# Patient Record
Sex: Female | Born: 1980
Health system: Southern US, Community
[De-identification: ages and names within clinical notes are randomized; demographics above are authoritative.]

## PROBLEM LIST (undated history)

## (undated) DIAGNOSIS — E039 Hypothyroidism, unspecified: Secondary | ICD-10-CM

## (undated) DIAGNOSIS — R51 Headache: Secondary | ICD-10-CM

## (undated) DIAGNOSIS — F1590 Other stimulant use, unspecified, uncomplicated: Secondary | ICD-10-CM

## (undated) DIAGNOSIS — T7840XA Allergy, unspecified, initial encounter: Secondary | ICD-10-CM

## (undated) DIAGNOSIS — D509 Iron deficiency anemia, unspecified: Secondary | ICD-10-CM

## (undated) DIAGNOSIS — E559 Vitamin D deficiency, unspecified: Secondary | ICD-10-CM

## (undated) DIAGNOSIS — F411 Generalized anxiety disorder: Secondary | ICD-10-CM

## (undated) DIAGNOSIS — E038 Other specified hypothyroidism: Principal | ICD-10-CM

## (undated) DIAGNOSIS — D649 Anemia, unspecified: Secondary | ICD-10-CM

## (undated) DIAGNOSIS — R519 Headache, unspecified: Secondary | ICD-10-CM

## (undated) DIAGNOSIS — E063 Autoimmune thyroiditis: Principal | ICD-10-CM

## (undated) HISTORY — DX: Hypothyroidism, unspecified: E03.9

## (undated) HISTORY — DX: Generalized anxiety disorder: F41.1

## (undated) HISTORY — DX: Iron deficiency anemia, unspecified: D50.9

## (undated) HISTORY — DX: Vitamin D deficiency, unspecified: E55.9

## (undated) HISTORY — DX: Other specified hypothyroidism: E03.8

## (undated) HISTORY — DX: Allergy, unspecified, initial encounter: T78.40XA

## (undated) HISTORY — DX: Anemia, unspecified: D64.9

## (undated) HISTORY — DX: Other stimulant use, unspecified, uncomplicated: F15.90

## (undated) HISTORY — DX: Headache: R51

## (undated) HISTORY — DX: Autoimmune thyroiditis: E06.3

## (undated) HISTORY — DX: Headache, unspecified: R51.9

---

## 2011-04-15 ENCOUNTER — Ambulatory Visit: Payer: Self-pay

## 2012-08-03 ENCOUNTER — Emergency Department: Payer: Self-pay | Admitting: Emergency Medicine

## 2012-11-11 ENCOUNTER — Inpatient Hospital Stay: Payer: Self-pay | Admitting: Obstetrics and Gynecology

## 2012-11-11 LAB — CBC WITH DIFFERENTIAL/PLATELET
Basophil #: 0.1 10*3/uL (ref 0.0–0.1)
Basophil %: 0.8 %
Eosinophil #: 0.2 10*3/uL (ref 0.0–0.7)
Eosinophil %: 2.1 %
HCT: 32.4 % — ABNORMAL LOW (ref 35.0–47.0)
HGB: 10.8 g/dL — ABNORMAL LOW (ref 12.0–16.0)
Lymphocyte #: 1.7 10*3/uL (ref 1.0–3.6)
Lymphocyte %: 14.6 %
MCH: 27.1 pg (ref 26.0–34.0)
MCHC: 33.3 g/dL (ref 32.0–36.0)
MCV: 81 fL (ref 80–100)
Monocyte #: 0.6 x10 3/mm (ref 0.2–0.9)
Monocyte %: 5.6 %
Neutrophil #: 8.8 10*3/uL — ABNORMAL HIGH (ref 1.4–6.5)
Neutrophil %: 76.9 %
Platelet: 238 10*3/uL (ref 150–440)
RBC: 3.99 10*6/uL (ref 3.80–5.20)
RDW: 15.2 % — ABNORMAL HIGH (ref 11.5–14.5)
WBC: 11.5 10*3/uL — ABNORMAL HIGH (ref 3.6–11.0)

## 2012-11-12 LAB — HEMATOCRIT: HCT: 27.5 % — ABNORMAL LOW (ref 35.0–47.0)

## 2013-11-30 LAB — HM PAP SMEAR: HM Pap smear: NEGATIVE

## 2014-06-29 NOTE — Op Note (Signed)
PATIENT NAME:  Melissa Harrison, Melissa Harrison MR#:  [id number] DATE OF BIRTH:  1980-03-19  DATE OF PROCEDURE:  11/11/2012  PREOPERATIVE DIAGNOSES: Term breech, 40 weeks plus, low AFI.   POSTOPERATIVE DIAGNOSIS:  Term breech, 40 weeks plus, low AFI.   PROCEDURE: Primary LUT C-section.    ESTIMATED BLOOD LOSS: 1000.   SURGEON: Deloris Ping. Nika Yazzie, M.D.   ASSISTANYolanda Bonine: Melody Burr, CNM.   FINDINGS: Term liveborn female infant with Apgars 8 and 9. Normal uterus, tubes and ovaries.   DESCRIPTION OF PROCEDURE: The patient was taken to the operating room and placed in supine position. After adequate general epidural anesthesia was instilled, the patient was prepped and draped in the usual sterile fashion. Pfannenstiel skin incision was drawn and cut approximately 2 fingerbreadths below the pubic symphysis and carried sharply down to the fascia. Fascia was nicked in the midline. The incision was extended in a superolateral manner with the curved Mayo scissors. The muscle bellies were sharply and bluntly dissected off of the rectus fascia. At this time, the patient was feeling the incision and she was put to sleep. The muscle bellies were then identified in the midline and split. The peritoneum was grasped, cut and entered and extended the incision. Bladder blade was placed. Bladder flap was created. Uterine incision was performed. The infant's feet were delivered. The infant's body was delivered with a blue towel. Nuchal arms were delivered, first the left, then the right, and the baby's head was delivered from the uterus. Pitocin was started. Cord was clamped and cut. Infant was handed to the awaiting pediatrician. Cord blood was obtained. Placenta was delivered manually. The placenta was delivered on its own. The uterus was delivered and wrapped in a moist laparotomy sponge. The interior of the uterus was curetted with a moist lap sponge. Uterine incision was closed with a running locked chromic suture and then a running  embrocating suture. The bladder flap was tacked back up to the uterine incision. The uterus was placed back into the abdomen. The muscle bellies were closed with a running Vicryl suture. The ON-Q pain pump trocars were placed, and the catheters were threaded. The trocars were removed. The catheters were primed, placed between the fascia and the muscle belly, wrapped around the muscle bellies. The fascia was closed with a running Vicryl suture.    DICTATION ENDS delete this     ____________________________ Elliot Gurneyarrie C. Joniece Smotherman, MD cck:dmm D: 11/15/2012 11:11:00 ET T: 11/15/2012 11:29:29 ET JOB#: [id number]  cc: Elliot Gurneyarrie C. Corinthia Helmers, MD, <Dictator> Elliot GurneyARRIE C Aubreanna Percle MD ELECTRONICALLY SIGNED 11/18/2012 10:05

## 2014-06-29 NOTE — Op Note (Signed)
PATIENT NAME:  Melissa Harrison, Vincie B MR#:  [id number] DATE OF BIRTH:  March 21, 1980  DATE OF PROCEDURE:  11/11/2012  PREOPERATIVE DIAGNOSIS:  Term breech presentation.  POSTOPERATIVE DIAGNOSIS:  Term breech presentation.  PROCEDURE: Primary lower uterine transverse (LUT) cesarean section.   ESTIMATED BLOOD LOSS: 1000 mL.   SURGEON:  Deloris Ping. Ragena Fiola, M.D.   ASSISTANYolanda Bonine:  Melody Burr, CNM.   FINDINGS: Term live-born female infant with Apgars of 8 and 9. Normal uterus, tubes and ovaries with breech presentation fetus.   PROCEDURE: The patient was taken to the operating room and placed in the supine position. After adequate general endotracheal anesthesia  (Dictation Anomaly)   after the attempt of spinal which did not give the patient adequate anesthesia, the procedure started. The patient had been prepped and draped in the usual sterile fashion prior to the anesthesia. A Pfannenstiel skin incision was drawn on the skin approximately 2 fingerbreadths (Dictation Anomaly) above the pubic symphysis, and a knife was used to carry this incision sharply down to the fascia. The fascia was nicked in the midline and the incision was extended in a superolateral manner. The (Dictation Anomaly) kocher clamps were asttached to the fascia; and the fascia, and the muscle bellies were sharply and bluntly dissected (Dictation Anomaly) from each other. The muscle belly midline was identified, split in the middle. The peritoneum was grasped, cut, and sharply entered with the Metzenbaum scissors in a superior-to-inferior manner. Bladder blade was placed. A bladder flap was created. An incision was made on the uterus. The infant's right foot and buttocks were seen. The right foot was delivered and the left foot was (Dictation Anomaly) then delivered also. The infant was wrapped in a moist blue towel and delivered to the shoulders. The nuchal arms were delivered, first right, then the left, and the infant's head was delivered. The cord  was clamped and cut. The infant was handed off to the awaiting pediatrician. Cord blood was obtained. Pitocin was started. Placenta delivered on its own. Uterus was delivered, wrapped in a moist laparotomy sponge. The anterior of the uterus was curetted with a moist lap sponge. Uterine incision was closed with a running, locked chromic suture, and then a running imbricating suture. Bladder flap was tacked back up to the uterine incision. The belly was cleared of clots. The uterus was placed back into the abdomen. The muscle bellies were closed with a running Vicryl suture. The On-Q trocars were placed. The catheters were threaded. The trocars were removed. Dermabond was placed on the skin. The catheters were wrapped (Dictation Anomaly) over the muscle belly between the muscle and the fascia. The fascia was closed in a running Vicryl suture. The skin edges were approximated with a 4-0 Monocryl. Steri-Strips were placed. A 4 x 4 and Tegaderm were placed on the trocar port sites; 4 x 4's and ABDs were placed on the skin incisions, which was taped to the abdomen. The uterus was massaged, clots were cleared. Clear urine was noted in the Foley catheter, and the patient was taken to recovery after having tolerated the procedure well.    ____________________________ Elliot Gurneyarrie C. Mclean Moya, MD cck:dm D: 11/16/2012 07:18:00 ET T: 11/16/2012 09:49:48 ET JOB#: [id number]  cc: Elliot Gurneyarrie C. Sohaib Vereen, MD, <Dictator> Elliot GurneyARRIE C Harshaan Whang MD ELECTRONICALLY SIGNED 11/18/2012 10:03

## 2014-06-29 NOTE — Op Note (Signed)
PATIENT NAME:  Melissa Harrison, Melissa Harrison MR#:  [id number] DATE OF BIRTH:  1980/08/25  DATE OF PROCEDURE:  11/11/2012  PREOPERATIVE DIAGNOSIS:  Term at breech.   DICTATION ENDS (Dictation Anomaly) <<MISSING TEXT>>   ____________________________ Elliot Gurneyarrie C. Isabele Lollar, MD cck:dmm D: 11/15/2012 11:04:32 ET T: 11/15/2012 11:20:26 ET JOB#: [id number]  cc: Elliot Gurneyarrie C. Larisha Vencill, MD, <Dictator>

## 2014-07-17 NOTE — H&P (Signed)
L&D Evaluation:  History:  HPI 34yo MWF sent over from office at 94w2dwith breech presentation and mild oligohydramnious for primary c/s   Presents with breeach presentation at term   Patient's Medical History No Chronic Illness   Patient's Surgical History none   Medications Pre Natal Vitamins  Tylenol (Acetaminophen)   Allergies NKDA   Social History none   Family History Non-Contributory   ROS:  ROS All systems were reviewed.  HEENT, CNS, GI, GU, Respiratory, CV, Renal and Musculoskeletal systems were found to be normal.   Exam:  Vital Signs stable   Urine Protein not completed   General no apparent distress   Mental Status clear   Chest clear   Heart normal sinus rhythm   Abdomen gravid, non-tender   Estimated Fetal Weight Average for gestational age   Fetal Position breech   Back no CVAT   Edema no edema   Mebranes Intact   FHT normal rate with no decels   Ucx irregular   Impression:  Impression breeach presentation at 40 weeks   Plan:  Plan primary c/s for breech presentation   Electronic Signatures: BEvonnie Pat(CNM)  (Signed 05-Sep-14 17:23)  Authored: L&D Evaluation   Last Updated: 05-Sep-14 17:23 by BEvonnie Pat(CNM)

## 2014-08-17 ENCOUNTER — Ambulatory Visit (INDEPENDENT_AMBULATORY_CARE_PROVIDER_SITE_OTHER): Payer: Managed Care, Other (non HMO) | Admitting: Family Medicine

## 2014-08-17 ENCOUNTER — Telehealth: Payer: Self-pay | Admitting: Obstetrics and Gynecology

## 2014-08-17 ENCOUNTER — Encounter: Payer: Self-pay | Admitting: Family Medicine

## 2014-08-17 VITALS — BP 116/80 | HR 102 | Temp 97.2°F | Resp 18 | Ht 67.0 in | Wt 166.9 lb

## 2014-08-17 DIAGNOSIS — J45909 Unspecified asthma, uncomplicated: Secondary | ICD-10-CM | POA: Diagnosis not present

## 2014-08-17 DIAGNOSIS — R0982 Postnasal drip: Secondary | ICD-10-CM

## 2014-08-17 DIAGNOSIS — J309 Allergic rhinitis, unspecified: Secondary | ICD-10-CM

## 2014-08-17 DIAGNOSIS — H109 Unspecified conjunctivitis: Secondary | ICD-10-CM | POA: Insufficient documentation

## 2014-08-17 DIAGNOSIS — H10501 Unspecified blepharoconjunctivitis, right eye: Secondary | ICD-10-CM | POA: Diagnosis not present

## 2014-08-17 MED ORDER — NEOMYCIN-POLYMYXIN-DEXAMETH 3.5-10000-0.1 OP SUSP: 1.0000 [drp] | OPHTHALMIC | Status: DC

## 2014-08-17 MED ORDER — LEVOCETIRIZINE DIHYDROCHLORIDE 5 MG PO TABS: 5.0000 mg | ORAL_TABLET | Freq: Every evening | ORAL | Status: DC

## 2014-08-17 NOTE — Progress Notes (Signed)
Name: Melissa Harrison   MRN: [id number]    DOB: Sep 24, 1980   Date:08/17/2014       Progress Note  Subjective  Chief Complaint  Chief Complaint  Patient presents with  . Facial Swelling    for about 48 hrs, patient has had right eye swelling & redness. Patient states there has been clear drainage and some itching.    HPI  Patient is here today with concerns regarding the following symptoms itchy red eyes, swelling, discharge, pain of the right eye. No fevers, vomiting, headaches or visual loss but there is blurriness in affected eye. Using claritin for allergies which is not helping much. Had some vigamox at home which is not helping much.    Past Medical History  Diagnosis Date  . Allergy     History  Substance Use Topics  . Smoking status: Former Games developer  . Smokeless tobacco: Not on file  . Alcohol Use: 0.0 oz/week    0 Standard drinks or equivalent per week     Comment: an ocassional glass of wine     Current outpatient prescriptions:  Marland Kitchen  VIGAMOX 0.5 % ophthalmic solution, INSTILL 1 DROP INTO THE AFFECTED EYE TID FOR 7 DAYS, Disp: , Rfl: 0  No Known Allergies  ROS  10 Systems reviewed and is negative except as mentioned in HPI.   Objective  Filed Vitals:   08/17/14 0902  BP: 116/80  Pulse: 102  Temp: 97.2 F (36.2 C)  TempSrc: Oral  Resp: 18  Height:  (1.702 m)  Weight: 166 lb 14.4 oz (75.705 kg)  SpO2: 97%     Physical Exam  Constitutional: Patient appears well-developed and well-nourished. In no distress.  HEENT:  - Head: Normocephalic and atraumatic.  - Ears: Bilateral TMs gray, no erythema or effusion - Nose: Nasal mucosa moist, boggy and congested. - Mouth/Throat: Oropharynx is clear and moist. No tonsillar hypertrophy or erythema. No post nasal drainage.  - Eyes: Right conjunctivae congested, injected, dull yellow discharge on eyelashes present. EOM movements normal. PERRLA. No scleral icterus.  Neck: Normal range of motion. Neck  supple. No JVD present. No thyromegaly present.  Cardiovascular: Normal rate, regular rhythm and normal heart sounds.  No murmur heard.  Pulmonary/Chest: Effort normal and breath sounds normal. No respiratory distress. Musculoskeletal: Normal range of motion bilateral UE and LE, no joint effusions. Peripheral vascular: Bilateral LE no edema. Neurological: CN II-XII grossly intact with no focal deficits. Alert and oriented to person, place, and time. Coordination, balance, strength, speech and gait are normal.  Skin: Skin is warm and dry. No rash noted. No erythema.  Psychiatric: Patient has a normal mood and affect. Behavior is normal in office today. Judgment and thought content normal in office today.   Assessment & Plan 1. Blepharoconjunctivitis of right eye Etiologies involve allergic rhinitis, viral or bacterial infection. Instructed patient on using warm/cold compressors on affect eye(s). Be mindful of washing hands frequently to avoid spreading possible infection. Tylenol or NSAID as needed for pain, inflammation, headache. If symptoms worsen contact PCP office for referral to Ophthamologist.   - neomycin-polymyxin b-dexamethasone (MAXITROL) 3.5-10000-0.1 SUSP; Place 1 drop into the right eye every 4 (four) hours.  Dispense: 5 mL; Refill: 0  2. Allergic rhinitis with postnasal drip Stop claritin and start Xyzal. If not covered on insurance plan try Allegra. Also pick up [place] eye drops for maintenance.   - levocetirizine (XYZAL) 5 MG tablet; Take 1 tablet (5 mg total) by mouth every evening.  Dispense: 30 tablet; Refill: 2

## 2014-08-17 NOTE — Telephone Encounter (Signed)
Pt called and she called in to the advice nurse and they told her to call in the morning and try to get seen, however her eye is swollen red and grenn discharge, she does not think its pink eye because no one else has it in her family, she htinks maybe sincus, i told her that we no longer to PCP care and she is established at [facility] but I told her I would check with you first and see if you could talk to her and r/o things. Pt would like a call back

## 2014-08-17 NOTE — Patient Instructions (Signed)
Allergic Conjunctivitis  The conjunctiva is a thin membrane that covers the visible white part of the eyeball and the underside of the eyelids. This membrane protects and lubricates the eye. The membrane has small blood vessels running through it that can normally be seen. When the conjunctiva becomes inflamed, the condition is called conjunctivitis. In response to the inflammation, the conjunctival blood vessels become swollen. The swelling results in redness in the normally white part of the eye.  The blood vessels of this membrane also react when a person has allergies and is then called allergic conjunctivitis. This condition usually lasts for as long as the allergy persists. Allergic conjunctivitis cannot be passed to another person (non-contagious). The likelihood of bacterial infection is great and the cause is not likely due to allergies if the inflamed eye has:  · A sticky discharge.  · Discharge or sticking together of the lids in the morning.  · Scaling or flaking of the eyelids where the eyelashes come out.  · Red swollen eyelids.  CAUSES   · Viruses.  · Irritants such as foreign bodies.  · Chemicals.  · General allergic reactions.  · Inflammation or serious diseases in the inside or the outside of the eye or the orbit (the boney cavity in which the eye sits) can cause a "red eye."  SYMPTOMS   · Eye redness.  · Tearing.  · Itchy eyes.  · Burning feeling in the eyes.  · Clear drainage from the eye.  · Allergic reaction due to pollens or ragweed sensitivity. Seasonal allergic conjunctivitis is frequent in the spring when pollens are in the air and in the fall.  DIAGNOSIS   This condition, in its many forms, is usually diagnosed based on the history and an ophthalmological exam. It usually involves both eyes. If your eyes react at the same time every year, allergies may be the cause. While most "red eyes" are due to allergy or an infection, the role of an eye (ophthalmological) exam is important. The exam  can rule out serious diseases of the eye or orbit.  TREATMENT   · Non-antibiotic eye drops, ointments, or medications by mouth may be prescribed if the ophthalmologist is sure the conjunctivitis is due to allergies alone.  · Over-the-counter drops and ointments for allergic symptoms should be used only after other causes of conjunctivitis have been ruled out, or as your caregiver suggests.  Medications by mouth are often prescribed if other allergy-related symptoms are present. If the ophthalmologist is sure that the conjunctivitis is due to allergies alone, treatment is normally limited to drops or ointments to reduce itching and burning.  HOME CARE INSTRUCTIONS   · Wash hands before and after applying drops or ointments, or touching the inflamed eye(s) or eyelids.  · Do not let the eye dropper tip or ointment tube touch the eyelid when putting medicine in your eye.  · Stop using your soft contact lenses and throw them away. Use a new pair of lenses when recovery is complete. You should run through sterilizing cycles at least three times before use after complete recovery if the old soft contact lenses are to be used. Hard contact lenses should be stopped. They need to be thoroughly sterilized before use after recovery.  · Itching and burning eyes due to allergies is often relieved by using a cool cloth applied to closed eye(s).  SEEK MEDICAL CARE IF:   · Your problems do not go away after two or three days of treatment.  ·   Your lids are sticky (especially in the morning when you wake up) or stick together.  · Discharge develops. Antibiotics may be needed either as drops, ointment, or by mouth.  · You have extreme light sensitivity.  · An oral temperature above 102° F (38.9° C) develops.  · Pain in or around the eye or any other visual symptom develops.  MAKE SURE YOU:   · Understand these instructions.  · Will watch your condition.  · Will get help right away if you are not doing well or get worse.  Document  Released: 05/16/2002 Document Revised: 05/18/2011 Document Reviewed: 04/11/2007  ExitCare® Patient Information ©2015 [employer]. This information is not intended to replace advice given to you by your health care provider. Make sure you discuss any questions you have with your health care provider.

## 2014-08-20 NOTE — Telephone Encounter (Signed)
Pt was notified that she will need to contact PCP for this matter

## 2014-11-26 ENCOUNTER — Encounter: Payer: Self-pay | Admitting: *Deleted

## 2014-12-04 ENCOUNTER — Encounter: Payer: Self-pay | Admitting: Obstetrics and Gynecology

## 2015-01-09 ENCOUNTER — Encounter: Payer: Self-pay | Admitting: *Deleted

## 2015-01-17 ENCOUNTER — Encounter: Payer: Self-pay | Admitting: Obstetrics and Gynecology

## 2015-01-17 ENCOUNTER — Ambulatory Visit (INDEPENDENT_AMBULATORY_CARE_PROVIDER_SITE_OTHER): Payer: Managed Care, Other (non HMO) | Admitting: Obstetrics and Gynecology

## 2015-01-17 VITALS — BP 124/81 | HR 93 | Ht 67.0 in | Wt 168.9 lb

## 2015-01-17 DIAGNOSIS — Z01419 Encounter for gynecological examination (general) (routine) without abnormal findings: Secondary | ICD-10-CM | POA: Diagnosis not present

## 2015-01-17 NOTE — Patient Instructions (Addendum)
Levonorgestrel intrauterine device (IUD) What is this medicine? LEVONORGESTREL IUD (LEE voe nor jes trel) is a contraceptive (birth control) device. The device is placed inside the uterus by a healthcare professional. It is used to prevent pregnancy and can also be used to treat heavy bleeding that occurs during your period. Depending on the device, it can be used for 3 to 5 years. This medicine may be used for other purposes; ask your health care provider or pharmacist if you have questions. What should I tell my health care provider before I take this medicine? They need to know if you have any of these conditions: -abnormal Pap smear -cancer of the breast, uterus, or cervix -diabetes -endometritis -genital or pelvic infection now or in the past -have more than one sexual partner or your partner has more than one partner -heart disease -history of an ectopic or tubal pregnancy -immune system problems -IUD in place -liver disease or tumor -problems with blood clots or take blood-thinners -use intravenous drugs -uterus of unusual shape -vaginal bleeding that has not been explained -an unusual or allergic reaction to levonorgestrel, other hormones, silicone, or polyethylene, medicines, foods, dyes, or preservatives -pregnant or trying to get pregnant -breast-feeding How should I use this medicine? This device is placed inside the uterus by a health care professional. Talk to your pediatrician regarding the use of this medicine in children. Special care may be needed. Overdosage: If you think you have taken too much of this medicine contact a poison control center or emergency room at once. NOTE: This medicine is only for you. Do not share this medicine with others. What if I miss a dose? This does not apply. What may interact with this medicine? Do not take this medicine with any of the following medications: -amprenavir -bosentan -fosamprenavir This medicine may also interact with  the following medications: -aprepitant -barbiturate medicines for inducing sleep or treating seizures -bexarotene -griseofulvin -medicines to treat seizures like carbamazepine, ethotoin, felbamate, oxcarbazepine, phenytoin, topiramate -modafinil -pioglitazone -rifabutin -rifampin -rifapentine -some medicines to treat HIV infection like atazanavir, indinavir, lopinavir, nelfinavir, tipranavir, ritonavir -St. John's wort -warfarin This list may not describe all possible interactions. Give your health care provider a list of all the medicines, herbs, non-prescription drugs, or dietary supplements you use. Also tell them if you smoke, drink alcohol, or use illegal drugs. Some items may interact with your medicine. What should I watch for while using this medicine? Visit your doctor or health care professional for regular check ups. See your doctor if you or your partner has sexual contact with others, becomes HIV positive, or gets a sexual transmitted disease. This product does not protect you against HIV infection (AIDS) or other sexually transmitted diseases. You can check the placement of the IUD yourself by reaching up to the top of your vagina with clean fingers to feel the threads. Do not pull on the threads. It is a good habit to check placement after each menstrual period. Call your doctor right away if you feel more of the IUD than just the threads or if you cannot feel the threads at all. The IUD may come out by itself. You may become pregnant if the device comes out. If you notice that the IUD has come out use a backup birth control method like condoms and call your health care provider. Using tampons will not change the position of the IUD and are okay to use during your period. What side effects may I notice from receiving this medicine?  Side effects that you should report to your doctor or health care professional as soon as possible: -allergic reactions like skin rash, itching or  hives, swelling of the face, lips, or tongue -fever, flu-like symptoms -genital sores -high blood pressure -no menstrual period for 6 weeks during use -pain, swelling, warmth in the leg -pelvic pain or tenderness -severe or sudden headache -signs of pregnancy -stomach cramping -sudden shortness of breath -trouble with balance, talking, or walking -unusual vaginal bleeding, discharge -yellowing of the eyes or skin Side effects that usually do not require medical attention (report to your doctor or health care professional if they continue or are bothersome): -acne -breast pain -change in sex drive or performance -changes in weight -cramping, dizziness, or faintness while the device is being inserted -headache -irregular menstrual bleeding within first 3 to 6 months of use -nausea This list may not describe all possible side effects. Call your doctor for medical advice about side effects. You may report side effects to FDA at 1-800-FDA-1088. Where should I keep my medicine? This does not apply. NOTE: This sheet is a summary. It may not cover all possible information. If you have questions about this medicine, talk to your doctor, pharmacist, or health care provider.    2016, Elsevier/Gold Standard. (2011-03-26 13:54:04)  Place annual gynecologic exam patient instructions here.  Thank you for enrolling in [facility]. Please follow the instructions below to securely access your online medical record. MyChart allows you to send messages to your doctor, view your test results, manage appointments, and more.   How Do I Sign Up? 1. In your Internet browser, go to [employer] and enter [email or web address]. 2. Click on the Sign Up Now link in the Sign In box. You will see the New Member Sign Up page. 3. Enter your MyChart Access Code exactly as it appears below. You will not need to use this code after you've completed the sign-up process. If you do not sign up before the  expiration date, you must request a new code.  MyChart Access Code: [email or web address] Expires: 03/05/2015  2:11 AM  4. Enter your Social Security Number ([email or web address]) and Date of Birth (mm/dd/yyyy) as indicated and click Submit. You will be taken to the next sign-up page. 5. Create a MyChart ID. This will be your MyChart login ID and cannot be changed, so think of one that is secure and easy to remember. 6. Create a MyChart password. You can change your password at any time. 7. Enter your Password Reset Question and Answer. This can be used at a later time if you forget your password.  8. Enter your e-mail address. You will receive e-mail notification when new information is available in [facility]. 9. Click Sign Up. You can now view your medical record.   Additional Information Remember, MyChart is NOT to be used for urgent needs. For medical emergencies, dial 911.

## 2015-01-17 NOTE — Progress Notes (Signed)
  Subjective:     Melissa Harrison is a 34 y.o. female and is here for a comprehensive physical exam. The patient reports no problems, but is interested in IUD.Marland Kitchen  Social History   Social History  . Marital Status: Married    Spouse Name: N/A  . Number of Children: 1  . Years of Education: 16   Occupational History  . Property Manager     [place] History Main Topics  . Smoking status: Former Research scientist (life sciences)  . Smokeless tobacco: Not on file  . Alcohol Use: 0.0 oz/week    0 Standard drinks or equivalent per week     Comment: an ocassional glass of wine  . Drug Use: No  . Sexual Activity:    Partners: Male    Birth Control/ Protection: None   Other Topics Concern  . Not on file   Social History Narrative   Health Maintenance  Topic Date Due  . HIV Screening  04/17/1995  . TETANUS/TDAP  04/17/1999  . INFLUENZA VACCINE  10/08/2014  . PAP SMEAR  11/30/2016    The following portions of the patient's history were reviewed and updated as appropriate: allergies, current medications, past family history, past medical history, past social history, past surgical history and problem list.  Review of Systems A comprehensive review of systems was negative.   Objective:    General appearance: alert, cooperative and appears stated age Neck: no adenopathy, no carotid bruit, no JVD, supple, symmetrical, trachea midline and thyroid not enlarged, symmetric, no tenderness/mass/nodules Lungs: clear to auscultation bilaterally Breasts: normal appearance, no masses or tenderness Heart: regular rate and rhythm, S1, S2 normal, no murmur, click, rub or gallop Abdomen: soft, non-tender; bowel sounds normal; no masses,  no organomegaly Pelvic: cervix normal in appearance, external genitalia normal, no adnexal masses or tenderness, no cervical motion tenderness, rectovaginal septum normal, uterus normal size, shape, and consistency and vagina normal without discharge    Assessment:    Healthy female exam. Contraception education      Plan:  Pap and labs not needed RTC in 2 weeks for IUD insertion and then 1 year for AE    See After Visit Summary for Counseling Recommendations

## 2015-01-23 ENCOUNTER — Other Ambulatory Visit: Payer: Self-pay | Admitting: Family Medicine

## 2015-01-23 DIAGNOSIS — H10501 Unspecified blepharoconjunctivitis, right eye: Secondary | ICD-10-CM

## 2015-01-23 NOTE — Telephone Encounter (Signed)
Need refill on Neomycin eye drops. Please send to [facility]

## 2015-01-23 NOTE — Telephone Encounter (Signed)
Refill request was sent to Dr. Ashany Sundaram for approval and submission.  

## 2015-05-22 ENCOUNTER — Encounter: Payer: Self-pay | Admitting: Obstetrics and Gynecology

## 2015-05-22 ENCOUNTER — Ambulatory Visit (INDEPENDENT_AMBULATORY_CARE_PROVIDER_SITE_OTHER): Payer: Managed Care, Other (non HMO) | Admitting: Obstetrics and Gynecology

## 2015-05-22 VITALS — BP 117/79 | HR 93 | Ht 67.0 in | Wt 167.7 lb

## 2015-05-22 DIAGNOSIS — R102 Pelvic and perineal pain: Secondary | ICD-10-CM | POA: Diagnosis not present

## 2015-05-22 NOTE — Progress Notes (Signed)
Subjective:     Patient ID: Melissa Harrison, female   DOB: 06-22-1980, 35 y.o.   MRN: [id number]  HPI  Reports sudden onset RLQ pain a week ago that lasted for a few hours, then reoccured 3 days ago, no aggrivating or alleviating factors. Denies pain with sex or changes in BM, urination, or activity. Not had this pain in the past.occured around ovulation and next menses is due next week. Denies fever. Review of Systems See above    Objective:   Physical Exam A&O x4  well gromed female in no distress Blood pressure 117/79, pulse 93, height 5' 7"  (1.702 m), weight 167 lb 11.2 oz (76.068 kg), last menstrual period 05/03/2015. Abdomen soft and non-tender, BS x4 normal Pelvic exam: normal external genitalia, vulva, vagina, cervix, uterus and adnexa, ADNEXA: tenderness right, no masses.    Assessment:     RLP intermittent; suspected ovarian cyst     Plan:     Counseled on ovarian cyst and typically spontaneous resolution, info given to review.  To call for pelvic u/s if pain persist after menses or worsens.  Melody Dalton, CNM

## 2015-05-22 NOTE — Patient Instructions (Signed)
Ovarian Cyst An ovarian cyst is a fluid-filled sac that forms on an ovary. The ovaries are small organs that produce eggs in women. Various types of cysts can form on the ovaries. Most are not cancerous. Many do not cause problems, and they often go away on their own. Some may cause symptoms and require treatment. Common types of ovarian cysts include:  Functional cysts--These cysts may occur every month during the menstrual cycle. This is normal. The cysts usually go away with the next menstrual cycle if the woman does not get pregnant. Usually, there are no symptoms with a functional cyst.  Endometrioma cysts--These cysts form from the tissue that lines the uterus. They are also called "chocolate cysts" because they become filled with blood that turns brown. This type of cyst can cause pain in the lower abdomen during intercourse and with your menstrual period.  Cystadenoma cysts--This type develops from the cells on the outside of the ovary. These cysts can get very big and cause lower abdomen pain and pain with intercourse. This type of cyst can twist on itself, cut off its blood supply, and cause severe pain. It can also easily rupture and cause a lot of pain.  Dermoid cysts--This type of cyst is sometimes found in both ovaries. These cysts may contain different kinds of body tissue, such as skin, teeth, hair, or cartilage. They usually do not cause symptoms unless they get very big.  Theca lutein cysts--These cysts occur when too much of a certain hormone (human chorionic gonadotropin) is produced and overstimulates the ovaries to produce an egg. This is most common after procedures used to assist with the conception of a baby (in vitro fertilization). CAUSES   Fertility drugs can cause a condition in which multiple large cysts are formed on the ovaries. This is called ovarian hyperstimulation syndrome.  A condition called polycystic ovary syndrome can cause hormonal imbalances that can lead to  nonfunctional ovarian cysts. SIGNS AND SYMPTOMS  Many ovarian cysts do not cause symptoms. If symptoms are present, they may include:  Pelvic pain or pressure.  Pain in the lower abdomen.  Pain during sexual intercourse.  Increasing girth (swelling) of the abdomen.  Abnormal menstrual periods.  Increasing pain with menstrual periods.  Stopping having menstrual periods without being pregnant. DIAGNOSIS  These cysts are commonly found during a routine or annual pelvic exam. Tests may be ordered to find out more about the cyst. These tests may include:  Ultrasound.  X-ray of the pelvis.  CT scan.  MRI.  Blood tests. TREATMENT  Many ovarian cysts go away on their own without treatment. Your health care provider may want to check your cyst regularly for 2-3 months to see if it changes. For women in menopause, it is particularly important to monitor a cyst closely because of the higher rate of ovarian cancer in menopausal women. When treatment is needed, it may include any of the following:  A procedure to drain the cyst (aspiration). This may be done using a long needle and ultrasound. It can also be done through a laparoscopic procedure. This involves using a thin, lighted tube with a tiny camera on the end (laparoscope) inserted through a small incision.  Surgery to remove the whole cyst. This may be done using laparoscopic surgery or an open surgery involving a larger incision in the lower abdomen.  Hormone treatment or birth control pills. These methods are sometimes used to help dissolve a cyst. HOME CARE INSTRUCTIONS   Only take over-the-counter  or prescription medicines as directed by your health care provider.  Follow up with your health care provider as directed.  Get regular pelvic exams and Pap tests. SEEK MEDICAL CARE IF:   Your periods are late, irregular, or painful, or they stop.  Your pelvic pain or abdominal pain does not go away.  Your abdomen becomes  larger or swollen.  You have pressure on your bladder or trouble emptying your bladder completely.  You have pain during sexual intercourse.  You have feelings of fullness, pressure, or discomfort in your stomach.  You lose weight for no apparent reason.  You feel generally ill.  You become constipated.  You lose your appetite.  You develop acne.  You have an increase in body and facial hair.  You are gaining weight, without changing your exercise and eating habits.  You think you are pregnant. SEEK IMMEDIATE MEDICAL CARE IF:   You have increasing abdominal pain.  You feel sick to your stomach (nauseous), and you throw up (vomit).  You develop a fever that comes on suddenly.  You have abdominal pain during a bowel movement.  Your menstrual periods become heavier than usual. MAKE SURE YOU:  Understand these instructions.  Will watch your condition.  Will get help right away if you are not doing well or get worse.   This information is not intended to replace advice given to you by your health care provider. Make sure you discuss any questions you have with your health care provider.   Document Released: 02/23/2005 Document Revised: 02/28/2013 Document Reviewed: 10/31/2012 Elsevier Interactive Patient Education [employer].

## 2015-06-07 ENCOUNTER — Emergency Department
Admission: EM | Admit: 2015-06-07 | Discharge: 2015-06-07 | Disposition: A | Discharge status: Home / Self Care | Payer: Managed Care, Other (non HMO) | Attending: Student | Admitting: Student

## 2015-06-07 ENCOUNTER — Encounter: Payer: Self-pay | Admitting: Emergency Medicine

## 2015-06-07 ENCOUNTER — Telehealth: Payer: Self-pay | Admitting: Obstetrics and Gynecology

## 2015-06-07 ENCOUNTER — Emergency Department: Payer: Managed Care, Other (non HMO)

## 2015-06-07 DIAGNOSIS — R1031 Right lower quadrant pain: Secondary | ICD-10-CM | POA: Diagnosis present

## 2015-06-07 DIAGNOSIS — N83201 Unspecified ovarian cyst, right side: Secondary | ICD-10-CM

## 2015-06-07 DIAGNOSIS — Z87891 Personal history of nicotine dependence: Secondary | ICD-10-CM | POA: Insufficient documentation

## 2015-06-07 DIAGNOSIS — R52 Pain, unspecified: Secondary | ICD-10-CM

## 2015-06-07 LAB — COMPREHENSIVE METABOLIC PANEL
ALT: 21 U/L (ref 14–54)
AST: 24 U/L (ref 15–41)
Albumin: 4.7 g/dL (ref 3.5–5.0)
Alkaline Phosphatase: 57 U/L (ref 38–126)
Anion gap: 3 — ABNORMAL LOW (ref 5–15)
BUN: 15 mg/dL (ref 6–20)
CO2: 29 mmol/L (ref 22–32)
Calcium: 9.7 mg/dL (ref 8.9–10.3)
Chloride: 102 mmol/L (ref 101–111)
Creatinine, Ser: 0.49 mg/dL (ref 0.44–1.00)
GFR calc Af Amer: >60 mL/min (ref >60)
GFR calc non Af Amer: >60 mL/min (ref >60)
Glucose, Bld: 99 mg/dL (ref 65–99)
Potassium: 3.9 mmol/L (ref 3.5–5.1)
Sodium: 134 mmol/L — ABNORMAL LOW (ref 135–145)
Total Bilirubin: 0.6 mg/dL (ref 0.3–1.2)
Total Protein: 8.1 g/dL (ref 6.5–8.1)

## 2015-06-07 LAB — CHLAMYDIA/NGC RT PCR (ARMC ONLY)
Chlamydia Tr: NOT DETECTED
N gonorrhoeae: NOT DETECTED

## 2015-06-07 LAB — CBC
HCT: 32.3 % — ABNORMAL LOW (ref 35.0–47.0)
Hemoglobin: 10 g/dL — ABNORMAL LOW (ref 12.0–16.0)
MCH: 20.2 pg — ABNORMAL LOW (ref 26.0–34.0)
MCHC: 31 g/dL — ABNORMAL LOW (ref 32.0–36.0)
MCV: 65.4 fL — ABNORMAL LOW (ref 80.0–100.0)
Platelets: 349 10*3/uL (ref 150–440)
RBC: 4.93 MIL/uL (ref 3.80–5.20)
RDW: 16.9 % — ABNORMAL HIGH (ref 11.5–14.5)
WBC: 7.8 10*3/uL (ref 3.6–11.0)

## 2015-06-07 LAB — WET PREP, GENITAL
Clue Cells Wet Prep HPF POC: NONE SEEN
Sperm: NONE SEEN
Trich, Wet Prep: NONE SEEN
Yeast Wet Prep HPF POC: NONE SEEN

## 2015-06-07 LAB — URINALYSIS COMPLETE WITH MICROSCOPIC (ARMC ONLY)
Bacteria, UA: NONE SEEN
Bilirubin Urine: NEGATIVE
Glucose, UA: NEGATIVE mg/dL
Hgb urine dipstick: NEGATIVE
Leukocytes, UA: NEGATIVE
Nitrite: NEGATIVE
Protein, ur: NEGATIVE mg/dL
Specific Gravity, Urine: 1.016 (ref 1.005–1.030)
pH: 6 (ref 5.0–8.0)

## 2015-06-07 LAB — LIPASE, BLOOD: Lipase: 17 U/L (ref 11–51)

## 2015-06-07 LAB — POCT PREGNANCY, URINE: Preg Test, Ur: NEGATIVE

## 2015-06-07 MED ORDER — TRAMADOL HCL 50 MG PO TABS: 50.0000 mg | ORAL_TABLET | Freq: Four times a day (QID) | ORAL | Status: DC | PRN

## 2015-06-07 NOTE — Telephone Encounter (Signed)
PT CALLED AND SHE STATED THAT THE PAIN SHE WAS HAVING A FEW WEEKS AGO WHEN SHE WAS HERE IS BACK AND SHE WANTED TO BE SEEN BUT NO PROVIDERS WERE IN OFFICE ON 06/07/15, TOLD HER WE WOULD GET BACK WITH HER ON MONDAY AND TRY TO FIT HER IN IF SHE NEEDED TO E SEEN.

## 2015-06-07 NOTE — ED Notes (Signed)
Pt c/o RLQ abdominal pain.  OBGYN thought may have ovarian cyst and explained it will go away with period.  Pain went away when pt had period but then came back.  Pain is now across entire lower abdomen. Denies vomiting or diarrhea.  Increased urinary frequency.

## 2015-06-07 NOTE — Telephone Encounter (Signed)
Pt is having persists pelvic pain. Taking 400mg  ibup but not helping. LMP 05/27/2015. NO uti sx. BM- wnl. Advised to increase ibup to 800 q 8h. Use heating pad. LV MNS suspected a ovarian cyst. Advised pt if sx persists she would need an u/s. Appt made for Tuesday at 9:45 and f/u with mns on Wed at 10:45. If ibup 800 is not helping pt advised to go to ER. Pt voices understanding.

## 2015-06-07 NOTE — ED Provider Notes (Signed)
[facility] Emergency Department Provider Note  ____________________________________________  Time seen: Approximately 12:16 PM  I have reviewed the triage vital signs and the nursing notes.   HISTORY  Chief Complaint Abdominal Pain    HPI [name] is a 35 y.o. female with history of anemia who presents for evaluation one day right lower quadrant pain which is now generalized to the entire lower abdomen, gradual onset, constant since onset, currently mild to moderate, no modifying factors. The patient reports that approximately a month ago she was seen by an OB/GYN doctor for right lower quadrant pain. She was thought to have a ovarian cyst clinically (no imaging performed). Her doctor told her that the pain was likely to go away after she had a menstrual period. She reports she did have a normal menstrual period approximately 10-12 days ago and her pain resolved however yesterday it reoccurred. No nausea, vomiting, diarrhea, fevers or chills. No chest pain or difficulty breathing. No abnormal vaginal bleeding or vaginal discharge.   Past Medical History  Diagnosis Date  . Allergy   . Anemia   . Headache     Patient Active Problem List   Diagnosis Date Noted  . Conjunctivitis, right eye 08/17/2014    Past Surgical History  Procedure Laterality Date  . Cesarean section      Current Outpatient Rx  Name  Route  Sig  Dispense  Refill  . cetirizine (ZYRTEC) 10 MG tablet   Oral   Take 10 mg by mouth daily. Reported on 05/22/2015         . neomycin-polymyxin b-dexamethasone (MAXITROL) 3.5-10000-0.1 SUSP   Right Eye   Place 1 drop into the right eye every 4 (four) hours. Patient not taking: Reported on 01/17/2015   5 mL   0   . traMADol (ULTRAM) 50 MG tablet   Oral   Take 1 tablet (50 mg total) by mouth every 6 (six) hours as needed.   15 tablet   0   . VIGAMOX 0.5 % ophthalmic solution      Reported on 05/22/2015      0      Dispense as written.     Allergies Penicillins  Family History  Problem Relation Age of Onset  . Cancer Father   . Cancer Paternal Grandmother     breast cancer  . Diabetes Sister   . Diabetes Brother   . Heart disease Brother   . Cancer Maternal Aunt     uterine    Social History Social History  Substance Use Topics  . Smoking status: Former Research scientist (life sciences)  . Smokeless tobacco: None  . Alcohol Use: 0.0 oz/week    0 Standard drinks or equivalent per week     Comment: an ocassional glass of wine    Review of Systems Constitutional: No fever/chills Eyes: No visual changes. ENT: No sore throat. Cardiovascular: Denies chest pain. Respiratory: Denies shortness of breath. Gastrointestinal: + abdominal pain.  No nausea, no vomiting.  No diarrhea.  No constipation. Genitourinary: Negative for dysuria. Musculoskeletal: Negative for back pain. Skin: Negative for rash. Neurological: Negative for headaches, focal weakness or numbness.  10-point ROS otherwise negative.  ____________________________________________   PHYSICAL EXAM:  VITAL SIGNS: ED Triage Vitals  Enc Vitals Group     BP 06/07/15 1119 132/91 mmHg     Pulse Rate 06/07/15 1119 85     Resp 06/07/15 1119 16     Temp 06/07/15 1119 98.6 F (37 C)  Temp Source 06/07/15 1119 Oral     SpO2 06/07/15 1119 100 %     Weight 06/07/15 1119 165 lb (74.844 kg)     Height 06/07/15 1119 5' 7"  (1.702 m)     Head Cir --      Peak Flow --      Pain Score 06/07/15 1120 5     Pain Loc --      Pain Edu? --      Excl. in [place]? --     Constitutional: Alert and oriented. Well appearing and in no acute distress. Eyes: Conjunctivae are normal. PERRL. EOMI. Head: Atraumatic. Nose: No congestion/rhinnorhea. Mouth/Throat: Mucous membranes are moist.  Oropharynx non-erythematous. Neck: No stridor.  Supple without meningismus. Cardiovascular: Normal rate, regular rhythm. Grossly normal heart sounds.  Good peripheral  circulation. Respiratory: Normal respiratory effort.  No retractions. Lungs CTAB. Gastrointestinal: Soft and nontender. No distention.  No CVA tenderness. Genitourinary: Thin white non-malodorous vaginal discharge from a closed os. No CMT or BMT. Musculoskeletal: No lower extremity tenderness nor edema.  No joint effusions. Neurologic:  Normal speech and language. No gross focal neurologic deficits are appreciated. No gait instability. Skin:  Skin is warm, dry and intact. No rash noted. Psychiatric: Mood and affect are normal. Speech and behavior are normal.  ____________________________________________   LABS (all labs ordered are listed, but only abnormal results are displayed)  Labs Reviewed  WET PREP, GENITAL - Abnormal; Notable for the following:    WBC, Wet Prep HPF POC FEW (*)    All other components within normal limits  COMPREHENSIVE METABOLIC PANEL - Abnormal; Notable for the following:    Sodium 134 (*)    Anion gap 3 (*)    All other components within normal limits  CBC - Abnormal; Notable for the following:    Hemoglobin 10.0 (*)    HCT 32.3 (*)    MCV 65.4 (*)    MCH 20.2 (*)    MCHC 31.0 (*)    RDW 16.9 (*)    All other components within normal limits  URINALYSIS COMPLETEWITH MICROSCOPIC ([facility] ONLY) - Abnormal; Notable for the following:    Color, Urine YELLOW (*)    APPearance CLEAR (*)    Ketones, ur TRACE (*)    Squamous Epithelial / LPF 0-5 (*)    All other components within normal limits  CHLAMYDIA/NGC RT PCR ([facility] ONLY)  LIPASE, BLOOD  POC URINE PREG, ED  POCT PREGNANCY, URINE   ____________________________________________  EKG  none ____________________________________________  RADIOLOGY  Transvaginal ultrasound  IMPRESSION: Unremarkable pelvic ultrasound. ____________________________________________   PROCEDURES  Procedure(s) performed: None  Critical Care performed:  No  ____________________________________________   INITIAL IMPRESSION / ASSESSMENT AND PLAN / ED COURSE  Pertinent labs & imaging results that were available during my care of the patient were reviewed by me and considered in my medical decision making (see chart for details).  Melissa Harrison is a 35 y.o. female with history of anemia who presents for evaluation one day right lower quadrant pain which is now generalized to the entire lower abdomen, gradual onset, constant since onset, currently mild to moderate, no modifying factors. On exam, she is very well-appearing and in no acute distress. Vital signs stable, she is afebrile. She has a benign physical examination. No abdominal tenderness, specifically, there is no tenderness to palpation in the right upper quadrant of the right lower quadrant. She has a reassuring pelvic exam. We'll obtain screening labs, urine pregnancy test as well  as transvaginal ultrasound to evaluate for cyst, rule out torsion. She has declined any pain medication stating that her pain is minimal.  ----------------------------------------- 2:57 PM on 06/07/2015 ----------------------------------------- Labs reviewed. CBC notable for anemia with hemoglobin 10 point which appears to be her baseline. Unremarkable CMP, lipase, urinalysis, negative pregnancy test, wet prep was also negative. Unremarkable pelvic ultrasound, no cyst or torsion. She continues to feel well, sitting in bed texting on her phone. she has not required any medication here in the emergency department. I discussed with her that the exact cause of her pain is not identified today however given her well appearance, benign abdominal examination, normal labs and ultrasound, I have a low suspicion for any acute life-threatening intra-abdominal pathologies as obstruction, perforation, appendicitis. We discussed symptomatic treatment, meticulous return precautions (especially for worsening right lower  quadrant pain, fevers, vomiting or diarrhea) and she is comfortable with the discharge plan. She has previously scheduled follow-up with her OB/GYN on Tuesday morning. ____________________________________________   FINAL CLINICAL IMPRESSION(S) / ED DIAGNOSES  Final diagnoses:  Pain  RLQ abdominal pain      Joanne Gavel, MD 06/07/15 1525

## 2015-06-11 ENCOUNTER — Other Ambulatory Visit: Payer: Managed Care, Other (non HMO)

## 2015-06-11 ENCOUNTER — Telehealth: Payer: Self-pay | Admitting: Family Medicine

## 2015-06-11 DIAGNOSIS — D509 Iron deficiency anemia, unspecified: Secondary | ICD-10-CM

## 2015-06-11 NOTE — Telephone Encounter (Signed)
I reviewed patient's ER chart and previous CBC; please ask her to schedule ER follow-up appointment with me to evaluate her anemia; she has very small, pale red blood cells suggesting she's been losing blood and I want to help her find out what's going on

## 2015-06-12 ENCOUNTER — Ambulatory Visit: Payer: Managed Care, Other (non HMO) | Admitting: Obstetrics and Gynecology

## 2015-06-12 NOTE — Telephone Encounter (Signed)
Patient notified is coming by to pick up copy of results and to make an appointment

## 2015-06-19 ENCOUNTER — Ambulatory Visit: Payer: Managed Care, Other (non HMO) | Admitting: Family Medicine

## 2015-06-28 ENCOUNTER — Ambulatory Visit: Payer: Managed Care, Other (non HMO) | Admitting: Obstetrics and Gynecology

## 2015-07-02 ENCOUNTER — Ambulatory Visit: Payer: Managed Care, Other (non HMO) | Admitting: Family Medicine

## 2015-08-13 ENCOUNTER — Encounter: Payer: Self-pay | Admitting: Family Medicine

## 2015-08-13 ENCOUNTER — Ambulatory Visit (INDEPENDENT_AMBULATORY_CARE_PROVIDER_SITE_OTHER): Payer: Managed Care, Other (non HMO) | Admitting: Family Medicine

## 2015-08-13 VITALS — BP 122/72 | HR 96 | Temp 98.5°F | Resp 16 | Wt 170.9 lb

## 2015-08-13 DIAGNOSIS — D509 Iron deficiency anemia, unspecified: Secondary | ICD-10-CM

## 2015-08-13 DIAGNOSIS — R5383 Other fatigue: Secondary | ICD-10-CM

## 2015-08-13 DIAGNOSIS — R1031 Right lower quadrant pain: Secondary | ICD-10-CM | POA: Insufficient documentation

## 2015-08-13 NOTE — Progress Notes (Signed)
BP 122/72 mmHg  Pulse 96  Temp(Src) 98.5 F (36.9 C) (Oral)  Resp 16  Wt 170 lb 14.4 oz (77.52 kg)  SpO2 99%  LMP 08/05/2015   Subjective:    Patient ID: Melissa Harrison, female    DOB: Apr 09, 1980, 35 y.o.   MRN: [id number]  HPI: Melissa Harrison is a 35 y.o. female  Chief Complaint  Patient presents with  . lab review   Patient was in the ER back in March; I reviewed the ER record, and sent a note to the staff to have her make an appointment to come in to get that evaluated; unfortunately, it is now two months later before patient has come in to be seen  No blood in urine on dip or micro  She has been having RLQ pain; they did an [place] to see if ovarian issue She saw [facility] and they sent her to ER  No blood from anywhere; normal periods about every 23 days; no big clots; five days of bleeding Meat-eater, but no dark green leafy vegetables No acid reducers Mother had anemia; she battled it growing up; does not take iron; not Mediterranean that she knows of  Energy level not as good as it could be; limits alcohol; daily walks; exercises; brief very transient light-headedness; gaining weight, short-tempered, usually 150 pounds, gaining weight despite the work   Depression screen [facility] 2/9 08/13/2015 08/17/2014  Decreased Interest 0 0  Down, Depressed, Hopeless 0 0  PHQ - 2 Score 0 0   Relevant past medical, surgical, family and social history reviewed PMH: allergies, anemia, headache  Past Surgical History  Procedure Laterality Date  . Cesarean section     Family History  Problem Relation Age of Onset  . Cancer Father     LUNG  . Cancer Paternal Grandmother     breast cancer  . Diabetes Sister   . Diabetes Brother   . Heart disease Brother     from his diabetes, heart attack  . Hypertension Brother   . Cancer Maternal Aunt     UTERINE   Social History  Substance Use Topics  . Smoking status: Former Games developer  . Smokeless tobacco: None  . Alcohol Use: 0.0  oz/week    0 Standard drinks or equivalent per week     Comment: an ocassional glass of wine   Interim medical history since last visit reviewed. Allergies and medications reviewed  Review of Systems Per HPI unless specifically indicated above     Objective:    BP 122/72 mmHg  Pulse 96  Temp(Src) 98.5 F (36.9 C) (Oral)  Resp 16  Wt 170 lb 14.4 oz (77.52 kg)  SpO2 99%  LMP 08/05/2015  Wt Readings from Last 3 Encounters:  09/06/15 171 lb (77.565 kg)  08/13/15 170 lb 14.4 oz (77.52 kg)  06/07/15 165 lb (74.844 kg)    Physical Exam  Constitutional: She appears well-developed and well-nourished. No distress.  HENT:  Head: Normocephalic and atraumatic.  Eyes: EOM are normal. No scleral icterus.  Neck: No thyromegaly present.  Cardiovascular: Normal rate, regular rhythm and normal heart sounds.   No murmur heard. Pulmonary/Chest: Effort normal and breath sounds normal. No respiratory distress. She has no wheezes.  Abdominal: Soft. Bowel sounds are normal. She exhibits no distension.  Musculoskeletal: Normal range of motion. She exhibits no edema.  Neurological: She is alert. She exhibits normal muscle tone.  Skin: Skin is warm and dry. She is not diaphoretic. No  pallor.  Psychiatric: She has a normal mood and affect. Her behavior is normal. Judgment and thought content normal.      Assessment & Plan:   Problem List Items Addressed This Visit      Other   RLQ abdominal pain    ddx discussed; US reviewed, unremarkable; will get stool cards and CT scan abd/pelvis      Relevant Orders   Comprehensive metabolic panel (Completed)   Microcytic hypochromic anemia - Primary    Reviewed ddx; will get labs and stool cards and CT scan      Relevant Orders   CBC with Differential/Platelet (Completed)   Iron Binding Cap (TIBC) (Completed)   Ferritin (Completed)   Hemoglobinopathy evaluation (Completed)    Other Visit Diagnoses    Other fatigue        Relevant Orders     VITAMIN D 25 Hydroxy (Vit-D Deficiency, Fractures)    TSH       Follow up plan: Return if symptoms worsen or fail to improve.  An after-visit summary was printed and given to the patient at check-out.  Please see the patient instructions which may contain other information and recommendations beyond what is mentioned above in the assessment and plan.   Orders Placed This Encounter  Procedures  . CBC with Differential/Platelet  . Comprehensive metabolic panel  . Iron Binding Cap (TIBC)  . Ferritin  . Hemoglobinopathy evaluation  . VITAMIN D 25 Hydroxy (Vit-D Deficiency, Fractures)  . TSH  . TSH  . VITAMIN D 25 Hydroxy (Vit-D Deficiency, Fractures)

## 2015-08-13 NOTE — Assessment & Plan Note (Signed)
Reviewed ddx; will get labs and stool cards and CT scan

## 2015-08-13 NOTE — Patient Instructions (Signed)
Let's get labs today Do the stool cards over the next several days We'll get a CT scan If you have not heard anything from my staff in a week about any orders/referrals/studies from today, please contact us here to follow-up [phone]

## 2015-08-13 NOTE — Assessment & Plan Note (Signed)
ddx discussed; US reviewed, unremarkable; will get stool cards and CT scan abd/pelvis

## 2015-08-14 ENCOUNTER — Telehealth: Payer: Self-pay

## 2015-08-14 ENCOUNTER — Encounter: Payer: Self-pay | Admitting: Family Medicine

## 2015-08-14 ENCOUNTER — Telehealth: Payer: Self-pay | Admitting: Family Medicine

## 2015-08-14 DIAGNOSIS — E034 Atrophy of thyroid (acquired): Secondary | ICD-10-CM

## 2015-08-14 DIAGNOSIS — E039 Hypothyroidism, unspecified: Secondary | ICD-10-CM

## 2015-08-14 DIAGNOSIS — E559 Vitamin D deficiency, unspecified: Secondary | ICD-10-CM

## 2015-08-14 DIAGNOSIS — D509 Iron deficiency anemia, unspecified: Secondary | ICD-10-CM

## 2015-08-14 HISTORY — DX: Hypothyroidism, unspecified: E03.9

## 2015-08-14 HISTORY — DX: Vitamin D deficiency, unspecified: E55.9

## 2015-08-14 HISTORY — DX: Iron deficiency anemia, unspecified: D50.9

## 2015-08-14 LAB — COMPREHENSIVE METABOLIC PANEL
ALT: 14 IU/L (ref 0–32)
AST: 17 IU/L (ref 0–40)
Albumin/Globulin Ratio: 1.5 (ref 1.2–2.2)
Albumin: 4.6 g/dL (ref 3.5–5.5)
Alkaline Phosphatase: 65 IU/L (ref 39–117)
BUN/Creatinine Ratio: 16 (ref 9–23)
BUN: 10 mg/dL (ref 6–20)
Bilirubin Total: <0.2 mg/dL (ref 0.0–1.2)
CO2: 24 mmol/L (ref 18–29)
Calcium: 9.5 mg/dL (ref 8.7–10.2)
Chloride: 99 mmol/L (ref 96–106)
Creatinine, Ser: 0.64 mg/dL (ref 0.57–1.00)
GFR calc Af Amer: 134 mL/min/{1.73_m2} (ref >59)
GFR calc non Af Amer: 116 mL/min/{1.73_m2} (ref >59)
Globulin, Total: 3 g/dL (ref 1.5–4.5)
Glucose: 97 mg/dL (ref 65–99)
Potassium: 4.4 mmol/L (ref 3.5–5.2)
Sodium: 140 mmol/L (ref 134–144)
Total Protein: 7.6 g/dL (ref 6.0–8.5)

## 2015-08-14 LAB — HEMOGLOBINOPATHY EVALUATION
HGB C: 0 %
HGB S: 0 %
Hemoglobin A2 Quantitation: 1.8 % (ref 0.7–3.1)
Hemoglobin F Quantitation: 0 % (ref 0.0–2.0)
Hgb A: 98.2 % — ABNORMAL HIGH (ref 94.0–98.0)

## 2015-08-14 LAB — CBC WITH DIFFERENTIAL/PLATELET
Basophils Absolute: 0.1 10*3/uL (ref 0.0–0.2)
Basos: 1 %
EOS (ABSOLUTE): 0.4 10*3/uL (ref 0.0–0.4)
Eos: 6 %
Hematocrit: 35 % (ref 34.0–46.6)
Hemoglobin: 10.6 g/dL — ABNORMAL LOW (ref 11.1–15.9)
Immature Grans (Abs): 0 10*3/uL (ref 0.0–0.1)
Immature Granulocytes: 0 %
Lymphocytes Absolute: 2 10*3/uL (ref 0.7–3.1)
Lymphs: 26 %
MCH: 22.2 pg — ABNORMAL LOW (ref 26.6–33.0)
MCHC: 30.3 g/dL — ABNORMAL LOW (ref 31.5–35.7)
MCV: 73 fL — ABNORMAL LOW (ref 79–97)
Monocytes Absolute: 0.7 10*3/uL (ref 0.1–0.9)
Monocytes: 9 %
Neutrophils Absolute: 4.3 10*3/uL (ref 1.4–7.0)
Neutrophils: 58 %
Platelets: 355 10*3/uL (ref 150–379)
RBC: 4.78 x10E6/uL (ref 3.77–5.28)
RDW: 20 % — ABNORMAL HIGH (ref 12.3–15.4)
WBC: 7.4 10*3/uL (ref 3.4–10.8)

## 2015-08-14 LAB — IRON AND TIBC
Iron Saturation: 4 % — CL (ref 15–55)
Iron: 16 ug/dL — ABNORMAL LOW (ref 27–159)
Total Iron Binding Capacity: 408 ug/dL (ref 250–450)
UIBC: 392 ug/dL (ref 131–425)

## 2015-08-14 LAB — VITAMIN D 25 HYDROXY (VIT D DEFICIENCY, FRACTURES): Vit D, 25-Hydroxy: 24 ng/mL — ABNORMAL LOW (ref 30.0–100.0)

## 2015-08-14 LAB — FERRITIN: Ferritin: 16 ng/mL (ref 15–150)

## 2015-08-14 LAB — TSH: TSH: 20.59 u[IU]/mL — ABNORMAL HIGH (ref 0.450–4.500)

## 2015-08-14 MED ORDER — VITAMIN C 500 MG PO TABS: 500.0000 mg | ORAL_TABLET | Freq: Every day | ORAL | Status: DC

## 2015-08-14 MED ORDER — FERROUS SULFATE 325 (65 FE) MG PO TABS: 325.0000 mg | ORAL_TABLET | Freq: Every day | ORAL | Status: DC

## 2015-08-14 MED ORDER — VITAMIN D3 50 MCG (2000 UT) PO CAPS: 2000.0000 [IU] | ORAL_CAPSULE | Freq: Every day | ORAL | Status: DC

## 2015-08-14 MED ORDER — LEVOTHYROXINE SODIUM 50 MCG PO TABS: 50.0000 ug | ORAL_TABLET | Freq: Every day | ORAL | Status: DC

## 2015-08-14 NOTE — Telephone Encounter (Signed)
I talked with patient about labs Vitamin D is low, start 2,000 iu vit D3 daily Iron is low, but anemia improving; normal Hgb Electrophoresis Start iron every other day for 1 week, then daily; take with vit C to help absorption Thyroid shows she is hypothyroid; start med, 1st thing in Am with water, empty stomach; recheck labs in 6 weeks; stop med and call if palpitations, rapid weight loss, etc.

## 2015-08-14 NOTE — Telephone Encounter (Signed)
Please review pt labs

## 2015-08-19 NOTE — Telephone Encounter (Signed)
See other phone note - already addressed.

## 2015-08-27 ENCOUNTER — Ambulatory Visit (INDEPENDENT_AMBULATORY_CARE_PROVIDER_SITE_OTHER): Payer: Managed Care, Other (non HMO) | Admitting: Family Medicine

## 2015-08-29 ENCOUNTER — Encounter (INDEPENDENT_AMBULATORY_CARE_PROVIDER_SITE_OTHER): Payer: Self-pay

## 2015-09-06 ENCOUNTER — Ambulatory Visit (INDEPENDENT_AMBULATORY_CARE_PROVIDER_SITE_OTHER): Payer: Managed Care, Other (non HMO) | Admitting: Family Medicine

## 2015-09-06 ENCOUNTER — Other Ambulatory Visit: Payer: Self-pay | Admitting: Family Medicine

## 2015-09-06 ENCOUNTER — Encounter: Payer: Self-pay | Admitting: Family Medicine

## 2015-09-06 ENCOUNTER — Other Ambulatory Visit: Payer: Self-pay

## 2015-09-06 VITALS — BP 116/74 | HR 97 | Temp 98.5°F | Resp 14 | Wt 171.0 lb

## 2015-09-06 DIAGNOSIS — D509 Iron deficiency anemia, unspecified: Secondary | ICD-10-CM | POA: Diagnosis not present

## 2015-09-06 DIAGNOSIS — H04129 Dry eye syndrome of unspecified lacrimal gland: Secondary | ICD-10-CM | POA: Insufficient documentation

## 2015-09-06 DIAGNOSIS — H04123 Dry eye syndrome of bilateral lacrimal glands: Secondary | ICD-10-CM

## 2015-09-06 DIAGNOSIS — E038 Other specified hypothyroidism: Secondary | ICD-10-CM

## 2015-09-06 DIAGNOSIS — E559 Vitamin D deficiency, unspecified: Secondary | ICD-10-CM | POA: Diagnosis not present

## 2015-09-06 DIAGNOSIS — E034 Atrophy of thyroid (acquired): Secondary | ICD-10-CM | POA: Diagnosis not present

## 2015-09-06 DIAGNOSIS — Z1211 Encounter for screening for malignant neoplasm of colon: Secondary | ICD-10-CM

## 2015-09-06 LAB — CBC WITH DIFFERENTIAL/PLATELET
Basophils Absolute: 72 cells/uL (ref 0–200)
Basophils Relative: 1 %
Eosinophils Absolute: 288 cells/uL (ref 15–500)
Eosinophils Relative: 4 %
HCT: 37.4 % (ref 35.0–45.0)
Hemoglobin: 11.7 g/dL (ref 11.7–15.5)
Lymphocytes Relative: 23 %
Lymphs Abs: 1656 cells/uL (ref 850–3900)
MCH: 23.9 pg — ABNORMAL LOW (ref 27.0–33.0)
MCHC: 31.3 g/dL — ABNORMAL LOW (ref 32.0–36.0)
MCV: 76.3 fL — ABNORMAL LOW (ref 80.0–100.0)
MPV: 10 fL (ref 7.5–12.5)
Monocytes Absolute: 648 cells/uL (ref 200–950)
Monocytes Relative: 9 %
Neutro Abs: 4536 cells/uL (ref 1500–7800)
Neutrophils Relative %: 63 %
Platelets: 330 10*3/uL (ref 140–400)
RBC: 4.9 MIL/uL (ref 3.80–5.10)
RDW: 21.4 % — ABNORMAL HIGH (ref 11.0–15.0)
WBC: 7.2 10*3/uL (ref 3.8–10.8)

## 2015-09-06 LAB — POC HEMOCCULT BLD/STL (HOME/3-CARD/SCREEN)
Card #1 Date: 6282017
Card #2 Date: 6292017
Card #2 Fecal Occult Blod, POC: NEGATIVE
Card #3 Date: 6302017
Card #3 Fecal Occult Blood, POC: NEGATIVE
Fecal Occult Blood, POC: NEGATIVE

## 2015-09-06 LAB — T4, FREE: Free T4: 1.1 ng/dL (ref 0.8–1.8)

## 2015-09-06 LAB — TSH: TSH: 7.4 mIU/L — ABNORMAL HIGH

## 2015-09-06 LAB — T3, FREE: T3, Free: 2.7 pg/mL (ref 2.3–4.2)

## 2015-09-06 MED ORDER — LEVOTHYROXINE SODIUM 75 MCG PO TABS: 75.0000 ug | ORAL_TABLET | Freq: Every day | ORAL | Status: DC

## 2015-09-06 NOTE — Progress Notes (Signed)
BP 116/74 mmHg  Pulse 97  Temp(Src) 98.5 F (36.9 C) (Oral)  Resp 14  Wt 171 lb (77.565 kg)  SpO2 97%  LMP 08/08/2015 (Approximate)   Subjective:    Patient ID: Melissa Harrison, female    DOB: 10/29/1980, 35 y.o.   MRN: [id number]  HPI: Melissa Harrison is a 35 y.o. female  Chief Complaint  Patient presents with  . Follow-up   Patient is being seen sooner than expected so she can discuss her thyroid issues Actually two pound weight gain since she started thyroid medicine about 3 weeks ago Took the edge off of frustraction and not as quick tempered Not feeling jittery and no palpitations Last heart rate was 96; no chest pain Maybe a little bit of improvement in her energy; this is not where she used to be in terms of energy Hair is really dry; skin is dry Bowels are normal No fullness and soreness over the neck  Taking vitamin D  Anemia; does eat red meat; taking iron pill every other day; no visible blood in stool; returned cards last week  She goes to eye doctor; very brief dizzy episodes; goes to eye doctor next week; hopes to get restasis; chronic dry eyes; no dry mouth; can't flush anything out of her eyes, get really irritated; no joint pain, no rash  [facility] 2[facility] 08/13/2015 08/17/2014  Decreased Interest 0 0  Down, Depressed, Hopeless 0 0  PHQ - 2 Score 0 0   Relevant past medical, surgical, family and social history reviewed Past Medical History  Diagnosis Date  . Allergy   . Anemia   . Headache   . Hypothyroidism 08/14/2015  . Vitamin D deficiency 08/14/2015  . Iron deficiency anemia 08/14/2015  . Hypothyroidism due to Hashimoto's thyroiditis 09/07/2015   Past Surgical History  Procedure Laterality Date  . Cesarean section     Family History  Problem Relation Age of Onset  . Cancer Father     LUNG  . Cancer Paternal Grandmother     breast cancer  . Diabetes Sister   . Diabetes Brother   . Heart disease Brother     from his diabetes,  heart attack  . Hypertension Brother   . Cancer Maternal Aunt     UTERINE  auto-immune disease, siblings have diabetes  Social History  Substance Use Topics  . Smoking status: Former Games developermoker  . Smokeless tobacco: None  . Alcohol Use: 0.0 oz/week    0 Standard drinks or equivalent per week     Comment: an ocassional glass of wine   Interim medical history since last visit reviewed. Allergies and medications reviewed  Review of Systems Per HPI unless specifically indicated above     Objective:    BP 116/74 mmHg  Pulse 97  Temp(Src) 98.5 F (36.9 C) (Oral)  Resp 14  Wt 171 lb (77.565 kg)  SpO2 97%  LMP 08/08/2015 (Approximate)  Wt Readings from Last 3 Encounters:  09/06/15 171 lb (77.565 kg)  08/13/15 170 lb 14.4 oz (77.52 kg)  06/07/15 165 lb (74.844 kg)    Physical Exam  Constitutional: She appears well-developed and well-nourished. No distress.  HENT:  Head: Normocephalic and atraumatic.  Eyes: EOM are normal. No scleral icterus.  Neck: No JVD present. No thyromegaly present.  Cardiovascular: Normal rate and regular rhythm.   Pulmonary/Chest: Effort normal.  Abdominal: She exhibits no distension.  Musculoskeletal: She exhibits no edema.  Psychiatric: She has a normal mood  and affect.      Assessment & Plan:   Problem List Items Addressed This Visit      Endocrine   Hypothyroidism - Primary    Check additional labs today; increase dose to 75      Relevant Medications   levothyroxine (SYNTHROID, LEVOTHROID) 75 MCG tablet   Other Relevant Orders   TSH (Completed)   Thyroid Peroxidase Antibody (Completed)   T4, free (Completed)   T3, free (Completed)     Other   Vitamin D deficiency    Taking 2,000 iu daily      Microcytic hypochromic anemia    Check CBC and ferritin and TIBC today; check stool card results      Relevant Orders   CBC with Differential/Platelet (Completed)   Iron, TIBC and Ferritin Panel   Chronically dry eyes   Relevant Orders    Sjogrens syndrome-A extractable nuclear antibody (Completed)   Sjogrens syndrome-B extractable nuclear antibody (Completed)   ANA,IFA RA Diag Pnl w/rflx Tit/Patn      Follow up plan: Return in about 6 weeks (around 10/18/2015) for next set of labs and visit.  An after-visit summary was printed and given to the patient at check-out.  Please see the patient instructions which may contain other information and recommendations beyond what is mentioned above in the assessment and plan.  Meds ordered this encounter  Medications  . levothyroxine (SYNTHROID, LEVOTHROID) 75 MCG tablet    Sig: Take 1 tablet (75 mcg total) by mouth daily before breakfast.    Dispense:  30 tablet    Refill:  1    Pharmacist -- we're increasing the dose    Orders Placed This Encounter  Procedures  . TSH  . CBC with Differential/Platelet  . Thyroid Peroxidase Antibody  . T4, free  . T3, free  . Iron, TIBC and Ferritin Panel  . Sjogrens syndrome-A extractable nuclear antibody  . Sjogrens syndrome-B extractable nuclear antibody  . ANA,IFA RA Diag Pnl w/rflx Tit/Patn

## 2015-09-06 NOTE — Patient Instructions (Signed)
Increase the thyroid medicine from 50 mcg daily to 75 mcg daily Okay to use 1-1/2 pills of the 50 to equal 75 We'll get additional labs today

## 2015-09-06 NOTE — Assessment & Plan Note (Signed)
Check additional labs today; increase dose to 75

## 2015-09-06 NOTE — Assessment & Plan Note (Signed)
Check CBC and ferritin and TIBC today; check stool card results

## 2015-09-06 NOTE — Assessment & Plan Note (Signed)
-

## 2015-09-07 ENCOUNTER — Telehealth: Payer: Self-pay | Admitting: Family Medicine

## 2015-09-07 ENCOUNTER — Encounter: Payer: Self-pay | Admitting: Family Medicine

## 2015-09-07 DIAGNOSIS — E038 Other specified hypothyroidism: Secondary | ICD-10-CM

## 2015-09-07 DIAGNOSIS — E063 Autoimmune thyroiditis: Principal | ICD-10-CM

## 2015-09-07 HISTORY — DX: Autoimmune thyroiditis: E06.3

## 2015-09-07 HISTORY — DX: Other specified hypothyroidism: E03.8

## 2015-09-07 LAB — THYROID PEROXIDASE ANTIBODY: Thyroperoxidase Ab SerPl-aCnc: 446 IU/mL — ABNORMAL HIGH (ref <9)

## 2015-09-07 NOTE — Telephone Encounter (Signed)
I talked with patient by phone on Saturday, explained lab results; believe she has Hashimoto's thyroiditis; encouraged her to read about it; can check out labs on-line; call back with any questions; we'll continue treatment; recheck labs as planned in 6 weeks; CBC improving nicely; Sjogren's tests still pending

## 2015-09-09 LAB — SJOGRENS SYNDROME-B EXTRACTABLE NUCLEAR ANTIBODY: SSB (La) (ENA) Antibody, IgG: <1

## 2015-09-09 LAB — SJOGRENS SYNDROME-A EXTRACTABLE NUCLEAR ANTIBODY: SSA (Ro) (ENA) Antibody, IgG: <1

## 2015-09-11 ENCOUNTER — Telehealth: Payer: Self-pay | Admitting: Emergency Medicine

## 2015-09-11 LAB — IRON AND TIBC
%SAT: 28 % (ref 11–50)
Iron: 116 ug/dL (ref 40–190)
TIBC: 417 ug/dL (ref 250–450)
UIBC: 301 ug/dL (ref 125–400)

## 2015-09-11 NOTE — Telephone Encounter (Signed)
Patient notified of lab results. ANA and iron studies will be added to labs. If there are any additional diagnosis, let me know.

## 2015-09-12 LAB — ANA: Anti Nuclear Antibody(ANA): NEGATIVE

## 2015-09-16 ENCOUNTER — Telehealth: Payer: Self-pay | Admitting: Emergency Medicine

## 2015-09-16 NOTE — Telephone Encounter (Signed)
Patient notified of lab results

## 2015-09-26 ENCOUNTER — Ambulatory Visit: Payer: Managed Care, Other (non HMO) | Admitting: Family Medicine

## 2015-10-15 ENCOUNTER — Encounter: Payer: Self-pay | Admitting: Family Medicine

## 2015-10-15 ENCOUNTER — Ambulatory Visit (INDEPENDENT_AMBULATORY_CARE_PROVIDER_SITE_OTHER): Payer: Managed Care, Other (non HMO) | Admitting: Family Medicine

## 2015-10-15 VITALS — BP 116/70 | HR 100 | Temp 98.0°F | Resp 16 | Wt 171.0 lb

## 2015-10-15 DIAGNOSIS — E063 Autoimmune thyroiditis: Secondary | ICD-10-CM | POA: Diagnosis not present

## 2015-10-15 DIAGNOSIS — M79672 Pain in left foot: Secondary | ICD-10-CM

## 2015-10-15 DIAGNOSIS — D509 Iron deficiency anemia, unspecified: Secondary | ICD-10-CM

## 2015-10-15 DIAGNOSIS — E038 Other specified hypothyroidism: Secondary | ICD-10-CM

## 2015-10-15 LAB — CBC WITH DIFFERENTIAL/PLATELET
Basophils Absolute: 72 cells/uL (ref 0–200)
Basophils Relative: 1 %
Eosinophils Absolute: 432 cells/uL (ref 15–500)
Eosinophils Relative: 6 %
HCT: 39.4 % (ref 35.0–45.0)
Hemoglobin: 12.7 g/dL (ref 11.7–15.5)
Lymphocytes Relative: 25 %
Lymphs Abs: 1800 cells/uL (ref 850–3900)
MCH: 26.2 pg — ABNORMAL LOW (ref 27.0–33.0)
MCHC: 32.2 g/dL (ref 32.0–36.0)
MCV: 81.2 fL (ref 80.0–100.0)
MPV: 10.3 fL (ref 7.5–12.5)
Monocytes Absolute: 504 cells/uL (ref 200–950)
Monocytes Relative: 7 %
Neutro Abs: 4392 cells/uL (ref 1500–7800)
Neutrophils Relative %: 61 %
Platelets: 302 10*3/uL (ref 140–400)
RBC: 4.85 MIL/uL (ref 3.80–5.10)
RDW: 18 % — ABNORMAL HIGH (ref 11.0–15.0)
WBC: 7.2 10*3/uL (ref 3.8–10.8)

## 2015-10-15 NOTE — Assessment & Plan Note (Signed)
Progressively worsening; refer to podiatrist

## 2015-10-15 NOTE — Patient Instructions (Signed)
We'll get labs today We'll have you see the foot doctor If you have not heard anything from my staff in a week about any orders/referrals/studies from today, please contact us here to follow-up [phone]

## 2015-10-15 NOTE — Progress Notes (Signed)
BP 116/70   Pulse 100   Temp 98 F (36.7 C) (Oral)   Resp 16   Wt 171 lb (77.6 kg)   LMP 10/10/2015   SpO2 98%   BMI 26.78 kg/m    Subjective:    Patient ID: Melissa Harrison, female    DOB: 1980/11/01, 35 y.o.   MRN: [id number]  HPI: MARCIEL OFFENBERGER is a 35 y.o. female  Chief Complaint  Patient presents with  . Follow-up    Patient was seen June 30th; note reviewed  Hypothyroidism; just discovered a few months ago; dose was bumped up to 75 mcg; only lost half of a pound; no palpitations or heart fluttering; no chest pain; not jittery; no loose stools; no excess hair loss; heart rate always around 100, in fact 102 last June  Vit D deficiency; still taking supplement  Microcytic hypochromic anemia; anemia had resolved, and cells getting bigger and denser; still tired, but better on the medicine; iron is helping, taking every other day  For about 2 years, she has bone spurs and torus in mouth and spur on left foot; cannot extend left great toe; getting worse; would like to see foot doctor; walking differently because of pain  Depression screen [facility] 2/9 10/15/2015 08/13/2015 08/17/2014  Decreased Interest 0 0 0  Down, Depressed, Hopeless 0 0 0  PHQ - 2 Score 0 0 0   Relevant past medical, surgical, family and social history reviewed Past Medical History:  Diagnosis Date  . Allergy   . Anemia   . Headache   . Hypothyroidism 08/14/2015  . Hypothyroidism due to Hashimoto's thyroiditis 09/07/2015  . Iron deficiency anemia 08/14/2015  . Vitamin D deficiency 08/14/2015   Family History  Problem Relation Age of Onset  . Cancer Father     LUNG  . Cancer Paternal Grandmother     breast cancer  . Diabetes Sister   . Diabetes Brother   . Heart disease Brother     from his diabetes, heart attack  . Hypertension Brother   . Cancer Maternal Aunt     UTERINE   Social History  Substance Use Topics  . Smoking status: Former Games developer  . Smokeless tobacco: Not on file  . Alcohol  use 0.0 oz/week     Comment: an ocassional glass of wine   Interim medical history since last visit reviewed. Allergies and medications reviewed  Review of Systems Per HPI unless specifically indicated above     Objective:    BP 116/70   Pulse 100   Temp 98 F (36.7 C) (Oral)   Resp 16   Wt 171 lb (77.6 kg)   LMP 10/10/2015   SpO2 98%   BMI 26.78 kg/m   Wt Readings from Last 3 Encounters:  10/15/15 171 lb (77.6 kg)  09/06/15 171 lb (77.6 kg)  08/13/15 170 lb 14.4 oz (77.5 kg)    Physical Exam  Constitutional: She appears well-developed and well-nourished.  HENT:  Mouth/Throat: Mucous membranes are normal.  Eyes: EOM are normal. No scleral icterus.  No proptosis or exophthalmos  Cardiovascular: Normal rate and regular rhythm.   Borderline tachycardia, just under 100 upon auscultation  Pulmonary/Chest: Effort normal and breath sounds normal.  Musculoskeletal:       Left foot: There is tenderness (ball of left foot).  Neurological: She is alert. She displays no tremor.  Psychiatric: She has a normal mood and affect. Her behavior is normal.   Results for orders placed or  performed in visit on 09/06/15  Iron and TIBC  Result Value Ref Range   Iron 116 40 - 190 ug/dL   UIBC 161301 096125 - 045400 ug/dL   TIBC 409417 811250 - 914450 ug/dL   %SAT 28 11 - 50 %  ANA  Result Value Ref Range   Anit Nuclear Antibody(ANA) NEG NEGATIVE      Assessment & Plan:   Problem List Items Addressed This Visit      Endocrine   Hypothyroidism due to Hashimoto's thyroiditis - Primary    Check TSH and free T4 today      Relevant Orders   TSH   T4, free     Other   Iron deficiency anemia    Check CBC      Relevant Orders   CBC with Differential/Platelet   Foot pain, left    Progressively worsening; refer to podiatrist      Relevant Orders   Ambulatory referral to Podiatry    Other Visit Diagnoses   None.     Follow up plan: Return in about 6 months (around 04/16/2016) for  follow-up, but I am here if needed.  An after-visit summary was printed and given to the patient at check-out.  Please see the patient instructions which may contain other information and recommendations beyond what is mentioned above in the assessment and plan.  Orders Placed This Encounter  Procedures  . TSH  . T4, free  . CBC with Differential/Platelet  . Ambulatory referral to Podiatry

## 2015-10-15 NOTE — Assessment & Plan Note (Signed)
Check TSH and free T4 today

## 2015-10-15 NOTE — Assessment & Plan Note (Signed)
Check CBC 

## 2015-10-16 ENCOUNTER — Other Ambulatory Visit: Payer: Self-pay | Admitting: Family Medicine

## 2015-10-16 LAB — TSH: TSH: 4.05 mIU/L

## 2015-10-16 LAB — T4, FREE: Free T4: 1.2 ng/dL (ref 0.8–1.8)

## 2015-10-16 MED ORDER — LEVOTHYROXINE SODIUM 88 MCG PO TABS: 88.0000 ug | ORAL_TABLET | Freq: Every day | ORAL | 1 refills | Status: DC

## 2015-10-16 NOTE — Assessment & Plan Note (Signed)
Check TSH in 6-8 weeks.

## 2015-10-24 ENCOUNTER — Ambulatory Visit (INDEPENDENT_AMBULATORY_CARE_PROVIDER_SITE_OTHER): Payer: Managed Care, Other (non HMO)

## 2015-10-24 ENCOUNTER — Ambulatory Visit (INDEPENDENT_AMBULATORY_CARE_PROVIDER_SITE_OTHER): Payer: Managed Care, Other (non HMO) | Admitting: Podiatry

## 2015-10-24 ENCOUNTER — Encounter: Payer: Self-pay | Admitting: Podiatry

## 2015-10-24 DIAGNOSIS — M779 Enthesopathy, unspecified: Secondary | ICD-10-CM | POA: Diagnosis not present

## 2015-10-24 DIAGNOSIS — R52 Pain, unspecified: Secondary | ICD-10-CM

## 2015-10-24 DIAGNOSIS — M799 Soft tissue disorder, unspecified: Secondary | ICD-10-CM | POA: Diagnosis not present

## 2015-10-24 DIAGNOSIS — M7989 Other specified soft tissue disorders: Secondary | ICD-10-CM

## 2015-10-24 MED ORDER — MELOXICAM 15 MG PO TABS: 15.0000 mg | ORAL_TABLET | Freq: Every day | ORAL | 2 refills | Status: DC

## 2015-10-24 NOTE — Progress Notes (Signed)
   Subjective:    Patient ID: Melissa Harrison, female    DOB: 08-28-1980, 35 y.o.   MRN: [id number]  HPI  35 year old female presents the significance of the left foot pain which is been ongoing for 2 years this has been worsening last couple weeks. She states that the areas painful when she is walks it was pressure to her foot. She denies tobacco any foreign objects or any recent injury or trauma. No numbness or tingling. The area does swell at times is tender and describes as a throbbing sensation. No other complaints   Review of Systems  All other systems reviewed and are negative.      Objective:   Physical Exam General: AAO x3, NAD  Dermatological: Skin is warm, dry and supple bilateral. Nails x 10 are well manicured; remaining integument appears unremarkable at this time. There are no open sores, no preulcerative lesions, no rash or signs of infection present.  Vascular: Dorsalis Pedis artery and Posterior Tibial artery pedal pulses are 2/4 bilateral with immedate capillary fill time. here is no pain with calf compression, swelling, warmth, erythema.   Neruologic: Grossly intact via light touch bilateral. Vibratory intact via tuning fork bilateral. Protective threshold with Semmes Wienstein monofilament intact to all pedal sites bilateral.   Musculoskeletal: There is tenderness just proximal to the sesamoids on the left foot and there is a firm soft tissue mass presentarea that any overlying erythema or increase in warmth or any open lesions. There is tenderness with a pumice lesion. There is no tenderness the exact sesamoid. There is no pain with MPJ range of motion. No other areas of tenderness bilaterally. MMT 5/5.  Gait: Unassisted, Nonantalgic.      Assessment & Plan:  35 year old female left foot soft tissue mass/capsulitis -Treatment options discussed including all alternatives, risks, and complications -Etiology of symptoms were discussed -X-rays were obtained and  reviewed with the patient. A skin markers utilized to identify the area mass. No identifiable foreign body or evidence of fracture them for this time. -I discussed their steroid injection to the area under this and she wishes to proceed understanding risks and complications. Under sterile conditions a mixture of dexamethasone and local anesthetic was infiltrated without couple complications. Post injection care discussed. After she was leaning she's having some pain in the area and area was iced and her symptoms resolved. Offloading pads were dispensed today. Discussed shoe gear modifications. Follow up in 4 weeks if symptoms continue or sooner if any issues are to arise. Meantime call any questions concerns.  Celesta Gentile, DPM

## 2015-11-02 ENCOUNTER — Other Ambulatory Visit: Payer: Self-pay | Admitting: Family Medicine

## 2015-11-02 NOTE — Telephone Encounter (Signed)
rx for 75 mcg declined; dose was increased to 88 mcg earlier with 2 months sent in

## 2015-11-21 ENCOUNTER — Encounter: Payer: Self-pay | Admitting: Obstetrics and Gynecology

## 2015-11-21 ENCOUNTER — Ambulatory Visit (INDEPENDENT_AMBULATORY_CARE_PROVIDER_SITE_OTHER): Payer: Managed Care, Other (non HMO) | Admitting: Obstetrics and Gynecology

## 2015-11-21 ENCOUNTER — Ambulatory Visit: Payer: Managed Care, Other (non HMO) | Admitting: Podiatry

## 2015-11-21 VITALS — BP 110/84 | HR 100 | Ht 67.5 in | Wt 169.3 lb

## 2015-11-21 DIAGNOSIS — E039 Hypothyroidism, unspecified: Secondary | ICD-10-CM | POA: Diagnosis not present

## 2015-11-21 DIAGNOSIS — E663 Overweight: Secondary | ICD-10-CM

## 2015-11-21 DIAGNOSIS — E559 Vitamin D deficiency, unspecified: Secondary | ICD-10-CM

## 2015-11-21 NOTE — Progress Notes (Signed)
Subjective:     Patient ID: Melissa Harrison, female   DOB: January 14, 1981, 35 y.o.   MRN: [id number]  HPI Desires starting weight loss program here. Frustrated over inability to loss weight, regardless of exercise and diet changes for last year. Feeling some better since thyroid is more stable, please see chart and PCP notes and labs. Weight has been stable at 169-172 x 1 year.  Review of Systems Negative other than above    Objective:   Physical Exam  A&Ox4 Well groomed female in no distress Blood pressure 110/84, pulse 100, height 5' 7.5" (1.715 m), weight 169 lb 4.8 oz (76.8 kg), last menstrual period 11/04/2015. Thyroid normal on exam      Assessment:     Overweight Hypothyroidism  Vitamin d deficiency    Plan:     Discussed weight loss program here and contraindication to uncontrolled thyroid disorders and adipex use. Am willing to start B12 injections and consider Adipex in 6 weeks if TSH remains stable. Labs redrawn today and will repeat in 4 weeks.RTC in 6 weeks to re-assess starting Adipex. B12 1064mg/ml given today. To continue OTC vit D until labs return and will consider RX dosing if needed.   >50% of 15 minute visit spent in counseling above.  [place]ody [place] CNM

## 2015-11-25 ENCOUNTER — Other Ambulatory Visit: Payer: Self-pay | Admitting: Obstetrics and Gynecology

## 2015-11-26 LAB — THYROID PANEL WITH TSH
Free Thyroxine Index: 2 (ref 1.2–4.9)
T3 Uptake Ratio: 27 % (ref 24–39)
T4, Total: 7.3 ug/dL (ref 4.5–12.0)
TSH: 4.52 u[IU]/mL — ABNORMAL HIGH (ref 0.450–4.500)

## 2015-11-28 ENCOUNTER — Telehealth: Payer: Self-pay | Admitting: *Deleted

## 2015-11-28 ENCOUNTER — Telehealth: Payer: Self-pay | Admitting: Family Medicine

## 2015-11-28 DIAGNOSIS — E034 Atrophy of thyroid (acquired): Secondary | ICD-10-CM

## 2015-11-28 MED ORDER — LEVOTHYROXINE SODIUM 100 MCG PO TABS: 100.0000 ug | ORAL_TABLET | Freq: Every day | ORAL | 1 refills | Status: DC

## 2015-11-28 NOTE — Telephone Encounter (Signed)
-----   Message from Joylene Igo, [place] sent at 11/28/2015 10:09 AM EDT ----- Please let her know lab results, TSH is slightly elevated again and needs to follow up with Endocrinologist.

## 2015-11-28 NOTE — Telephone Encounter (Signed)
Notified pt she will contact her dr

## 2015-11-28 NOTE — Telephone Encounter (Signed)
Pt.notified

## 2015-11-28 NOTE — Telephone Encounter (Signed)
Please let pt know that her thyroid is close to goal, but is outside the normal range We'll increase it one more level, from 88 to 100 mcg daily We're getting close, though, so if she notices palpitations, rapid weight loss, feeling anxious or jittery, loose stools, then back down to 88 mcg daily and call us Don't throw out the 88 mcg strength because she may end up being one of those people who has to alternate (88 mcg and 100 mcg every other day) We'll try 100 mcg daily though and see how she does Recheck TSH and free T4 in 6 weeks

## 2015-11-28 NOTE — Assessment & Plan Note (Signed)
Increase dose, check TSH and free T4 in 6 weeks

## 2015-12-11 ENCOUNTER — Encounter: Payer: Self-pay | Admitting: Obstetrics and Gynecology

## 2015-12-11 ENCOUNTER — Encounter: Payer: Self-pay | Admitting: Podiatry

## 2015-12-11 ENCOUNTER — Telehealth: Payer: Self-pay | Admitting: Obstetrics and Gynecology

## 2015-12-11 NOTE — Telephone Encounter (Signed)
Pt has a question about starting the weight loss medication. Her thyroid levels were up 2-3 wks ago and sge is wondering if she should wait 60moe month to start the wt loss medication

## 2015-12-12 NOTE — Telephone Encounter (Signed)
Was addressed through mychart message

## 2015-12-23 ENCOUNTER — Telehealth: Payer: Self-pay | Admitting: Obstetrics and Gynecology

## 2015-12-23 ENCOUNTER — Other Ambulatory Visit: Payer: Self-pay | Admitting: *Deleted

## 2015-12-23 DIAGNOSIS — E559 Vitamin D deficiency, unspecified: Secondary | ICD-10-CM

## 2015-12-23 DIAGNOSIS — E038 Other specified hypothyroidism: Secondary | ICD-10-CM

## 2015-12-23 DIAGNOSIS — E063 Autoimmune thyroiditis: Principal | ICD-10-CM

## 2015-12-23 NOTE — Telephone Encounter (Signed)
Pt called and melody told her that she needed to get tested for her thyroid for 3 months in order to start the weight loss, she gave her a lab slip last moth for her thyroid but she now needs another one for this month and she stated that Melody wanted to check her vitamin D also.   [facility] that is closer to her work so she wants [place] to fax the lab orders to her at [phone]

## 2015-12-23 NOTE — Telephone Encounter (Signed)
Done-ac 

## 2015-12-24 ENCOUNTER — Encounter: Payer: Self-pay | Admitting: Family Medicine

## 2015-12-24 ENCOUNTER — Ambulatory Visit (INDEPENDENT_AMBULATORY_CARE_PROVIDER_SITE_OTHER): Payer: Managed Care, Other (non HMO) | Admitting: Family Medicine

## 2015-12-24 VITALS — BP 122/84 | HR 91 | Temp 98.1°F | Resp 14 | Wt 173.0 lb

## 2015-12-24 DIAGNOSIS — G4733 Obstructive sleep apnea (adult) (pediatric): Secondary | ICD-10-CM | POA: Diagnosis not present

## 2015-12-24 DIAGNOSIS — E038 Other specified hypothyroidism: Secondary | ICD-10-CM

## 2015-12-24 DIAGNOSIS — E063 Autoimmune thyroiditis: Secondary | ICD-10-CM

## 2015-12-24 DIAGNOSIS — Z23 Encounter for immunization: Secondary | ICD-10-CM

## 2015-12-24 DIAGNOSIS — G4730 Sleep apnea, unspecified: Secondary | ICD-10-CM | POA: Insufficient documentation

## 2015-12-24 MED ORDER — SCOPOLAMINE 1 MG/3DAYS TD PT72: 1.0000 | MEDICATED_PATCH | TRANSDERMAL | 0 refills | Status: DC

## 2015-12-24 MED ORDER — LORAZEPAM 0.5 MG PO TABS: 0.2500 mg | ORAL_TABLET | Freq: Two times a day (BID) | ORAL | 0 refills | Status: DC | PRN

## 2015-12-24 NOTE — Assessment & Plan Note (Addendum)
Checking TSH through GYN office, they can send me results for dose adjustments; patient may benefit from pushing the TSH down closer to 1 instead of leaving at the 4 end of the scale

## 2015-12-24 NOTE — Patient Instructions (Addendum)
You received the flu shot today; it should protect you against the flu virus over the coming months; it will take about two weeks for antibodies to develop; do try to stay away from hospitals, nursing homes, and daycares during peak flu season; taking extra vitamin C daily during flu season may help you avoid getting sick  Consider sublingual (SL) vitamin B12 daily, either 1000 or 500 mcg  Use the patch if needed for nausea or sea sickness Use the new pill if needed for anxiety (no alcohol)  I've referred you to the Ear Nose Throat doctor If you have not heard anything from my staff in a week about any orders/referrals/studies from today, please contact us here to follow-up [phone]

## 2015-12-24 NOTE — Assessment & Plan Note (Addendum)
Witnessed apnea (by husband); refer to ENT

## 2015-12-24 NOTE — Progress Notes (Signed)
BP 122/84   Pulse 91   Temp 98.1 F (36.7 C) (Oral)   Resp 14   Wt 173 lb (78.5 kg)   LMP 12/24/2015   SpO2 97%   BMI 26.70 kg/m    Subjective:    Patient ID: Melissa Harrison, female    DOB: [birth year], 35 y.o.   MRN: [id number]  HPI: Melissa Harrison is a 35 y.o. female  Chief Complaint  Patient presents with  . Follow-up   Patient is here to discuss a few things Going to [place]; five day trip; not excited about plane trips; handles alcohol well Would like scopolamine patch Just got flu shot  The levothyroxine dose may not be strong enough; she was found to have hypothyroidism earlier this year Working with [employer]; she is working with her gyn for a weight loss program She was listening the [facility] pharmacy on [address]; heard about hashimoto's; TSH just checked yesterday No thoughts of SI/HI; low motivation, tired, trouble losing weight She received a B12 shot while she was there  Husband says she stops breathing when she sleeps; going on for 8 years; always tired in the morning; maybe refreshed sleep only 1/10 of the time, maybe; not waking up gasping for air in the middle of the night  Depression screen [facility] 2/9 12/24/2015 10/15/2015 08/13/2015 08/17/2014  Decreased Interest 0 0 0 0  Down, Depressed, Hopeless 1 0 0 0  PHQ - 2 Score 1 0 0 0   Relevant past medical, surgical, family and social history reviewed Past Medical History:  Diagnosis Date  . Allergy   . Anemia   . Headache   . Hypothyroidism 08/14/2015  . Hypothyroidism due to Hashimoto's thyroiditis 09/07/2015  . Iron deficiency anemia 08/14/2015  . Vitamin D deficiency 08/14/2015   Past Surgical History:  Procedure Laterality Date  . CESAREAN SECTION     Family History  Problem Relation Age of Onset  . Cancer Father     LUNG  . Cancer Paternal Grandmother     breast cancer  . Diabetes Sister   . Diabetes Brother   . Heart disease Brother     from his diabetes, heart attack  . Hypertension  Brother   . Cancer Maternal Aunt     UTERINE   Social History  Substance Use Topics  . Smoking status: Former Games developer  . Smokeless tobacco: Never Used  . Alcohol use 0.0 oz/week     Comment: an ocassional glass of wine   Interim medical history since last visit reviewed. Allergies and medications reviewed  Review of Systems Per HPI unless specifically indicated above     Objective:    BP 122/84   Pulse 91   Temp 98.1 F (36.7 C) (Oral)   Resp 14   Wt 173 lb (78.5 kg)   LMP 12/24/2015   SpO2 97%   BMI 26.70 kg/m   Wt Readings from Last 3 Encounters:  12/24/15 173 lb (78.5 kg)  11/21/15 169 lb 4.8 oz (76.8 kg)  10/15/15 171 lb (77.6 kg)    Physical Exam  Constitutional: She appears well-developed and well-nourished.  Weight gain 3+ pounds over last month  HENT:  Mouth/Throat: Mucous membranes are normal.  Eyes: EOM are normal. No scleral icterus.  Cardiovascular: Normal rate and regular rhythm.   Pulmonary/Chest: Effort normal and breath sounds normal.  Musculoskeletal: She exhibits no edema.  Psychiatric: She has a normal mood and affect. Her behavior is normal.  Results for orders placed or performed in visit on 11/25/15  Thyroid Panel With TSH  Result Value Ref Range   TSH 4.520 (H) 0.450 - 4.500 uIU/mL   T4, Total 7.3 4.5 - 12.0 ug/dL   T3 Uptake Ratio 27 24 - 39 %   Free Thyroxine Index 2.0 1.2 - 4.9      Assessment & Plan:   Problem List Items Addressed This Visit      Respiratory   Sleep apnea    Witnessed apnea (by husband); refer to ENT      Relevant Orders   Ambulatory referral to ENT     Endocrine   Hypothyroidism due to Hashimoto's thyroiditis - Primary    Checking TSH through GYN office, they can send me results for dose adjustments; patient may benefit from pushing the TSH down closer to 1 instead of leaving at the 4 end of the scale       Other Visit Diagnoses    Needs flu shot       Relevant Orders   Flu Vaccine QUAD 36+ mos  PF IM (Fluarix & Fluzone Quad PF) (Completed)       Follow up plan: No Follow-up on file.  An after-visit summary was printed and given to the patient at check-out.  Please see the patient instructions which may contain other information and recommendations beyond what is mentioned above in the assessment and plan.  Meds ordered this encounter  Medications  . scopolamine (TRANSDERM-SCOP) 1 MG/3DAYS    Sig: Place 1 patch (1.5 mg total) onto the skin every 3 (three) days.    Dispense:  5 patch    Refill:  0  . LORazepam (ATIVAN) 0.5 MG tablet    Sig: Take 0.5-1 tablets (0.25-0.5 mg total) by mouth 2 (two) times daily as needed for anxiety. No alcohol    Dispense:  5 tablet    Refill:  0    Orders Placed This Encounter  Procedures  . Flu Vaccine QUAD 36+ mos PF IM (Fluarix & Fluzone Quad PF)  . Ambulatory referral to ENT

## 2015-12-26 ENCOUNTER — Ambulatory Visit: Payer: Managed Care, Other (non HMO) | Admitting: Obstetrics and Gynecology

## 2015-12-26 ENCOUNTER — Telehealth: Payer: Self-pay | Admitting: Family Medicine

## 2015-12-26 DIAGNOSIS — E034 Atrophy of thyroid (acquired): Secondary | ICD-10-CM

## 2015-12-26 MED ORDER — LEVOTHYROXINE SODIUM 100 MCG PO TABS: 100.0000 ug | ORAL_TABLET | ORAL | 0 refills | Status: DC

## 2015-12-26 MED ORDER — LEVOTHYROXINE SODIUM 112 MCG PO TABS: 112.0000 ug | ORAL_TABLET | ORAL | 0 refills | Status: DC

## 2015-12-26 NOTE — Telephone Encounter (Signed)
Called [facility], they are sending over tsh, vitamin d is still pending

## 2015-12-26 NOTE — Assessment & Plan Note (Signed)
Adjust dose, discussed s/s of over-replacement; recheck TSH in 6-8 weeks

## 2015-12-26 NOTE — Telephone Encounter (Signed)
PT IS ASKING FOR RESULTS AND IS ACTIVE ON MY CHART BUT THEY ARE NOT OUT THERE. SAYS THAT DR LADA LOOKED AND SAID THE OTHER DAY THAT IT LOOKED LIKE THEY WERE HELD UP AND TO CALL BACK . PLEASE CALL PT.

## 2015-12-26 NOTE — Telephone Encounter (Signed)
[place] TSH 2.570 (normal 0.45-4.5) T4 7.3 (normal 4.5-12) T3 uptake 27 (normal 24-39) FTI 2 (normal 1.2-4.9) ----------------------------- I called patient We'll alternate 100 and 112 Recheck tsh in 6-8 weeks Call me sooner if over-replacement; discussed s/s She agrees

## 2015-12-30 ENCOUNTER — Encounter: Payer: Self-pay | Admitting: Family Medicine

## 2016-01-13 ENCOUNTER — Telehealth: Payer: Self-pay | Admitting: Obstetrics and Gynecology

## 2016-01-13 NOTE — Telephone Encounter (Signed)
She needs a new [facility] requestion for a final lab before she goes on Phentermine  Fax to [phone])

## 2016-01-15 NOTE — Telephone Encounter (Signed)
Done-ac 

## 2016-01-22 ENCOUNTER — Encounter: Payer: Self-pay | Admitting: Obstetrics and Gynecology

## 2016-01-22 ENCOUNTER — Other Ambulatory Visit: Payer: Self-pay | Admitting: Obstetrics and Gynecology

## 2016-01-22 ENCOUNTER — Ambulatory Visit (INDEPENDENT_AMBULATORY_CARE_PROVIDER_SITE_OTHER): Payer: Managed Care, Other (non HMO) | Admitting: Obstetrics and Gynecology

## 2016-01-22 ENCOUNTER — Telehealth: Payer: Self-pay

## 2016-01-22 VITALS — BP 109/86 | HR 105 | Ht 67.0 in | Wt 170.8 lb

## 2016-01-22 DIAGNOSIS — Z01419 Encounter for gynecological examination (general) (routine) without abnormal findings: Secondary | ICD-10-CM | POA: Diagnosis not present

## 2016-01-22 DIAGNOSIS — E663 Overweight: Secondary | ICD-10-CM | POA: Diagnosis not present

## 2016-01-22 MED ORDER — PHENTERMINE HCL 37.5 MG PO TABS: 37.5000 mg | ORAL_TABLET | Freq: Every day | ORAL | 2 refills | Status: DC

## 2016-01-22 MED ORDER — CYANOCOBALAMIN 1000 MCG/ML IJ SOLN: 1000.0000 ug | INTRAMUSCULAR | 1 refills | Status: DC

## 2016-01-22 NOTE — Patient Instructions (Signed)
Place annual gynecologic exam patient instructions here.

## 2016-01-22 NOTE — Progress Notes (Signed)
Subjective:   Melissa Harrison is a 35 y.o. G1P0 Caucasian female here for a routine well-woman exam.  Patient's last menstrual period was 01/12/2016.    Current complaints: none PCP: Lada       Does not desire labs Flu vaccine UTD TDAP UPT  Social History: Sexual: heterosexual Marital Status: married Living situation: with spouse Occupation: Investment banker, corporateroperty Manager Tobacco/alcohol: ETOH socially, 2 cups coffee/day Illicit drugs: no history of illicit drug use  The following portions of the patient's history were reviewed and updated as appropriate: allergies, current medications, past family history, past medical history, past social history, past surgical history and problem list.  Past Medical History Past Medical History:  Diagnosis Date  . Allergy   . Anemia   . Headache   . Hypothyroidism 08/14/2015  . Hypothyroidism due to Hashimoto's thyroiditis 09/07/2015  . Iron deficiency anemia 08/14/2015  . Vitamin D deficiency 08/14/2015    Past Surgical History Past Surgical History:  Procedure Laterality Date  . CESAREAN SECTION      Gynecologic History G1P0  Patient's last menstrual period was 01/12/2016. Contraception: coitus interruptus Last Pap: 2015. Results were: normal  Obstetric History OB History  Gravida Para Term Preterm AB Living  1         1  SAB TAB Ectopic Multiple Live Births          1    # Outcome Date GA Lbr Len/2nd Weight Sex Delivery Anes PTL Lv  1 Gravida 11/11/12     CS-Unspec   LIV      Current Medications Current Outpatient Prescriptions on File Prior to Visit  Medication Sig Dispense Refill  . levothyroxine (SYNTHROID, LEVOTHROID) 100 MCG tablet Take 1 tablet (100 mcg total) by mouth every other day. 45 tablet 0  . levothyroxine (SYNTHROID, LEVOTHROID) 112 MCG tablet Take 1 tablet (112 mcg total) by mouth every other day. 45 tablet 0  . Lifitegrast (XIIDRA) 5 % SOLN Apply to eye.    Marland Kitchen. LORazepam (ATIVAN) 0.5 MG tablet Take 0.5-1 tablets  (0.25-0.5 mg total) by mouth 2 (two) times daily as needed for anxiety. No alcohol 5 tablet 0  . scopolamine (TRANSDERM-SCOP) 1 MG/3DAYS Place 1 patch (1.5 mg total) onto the skin every 3 (three) days. 5 patch 0  . Cholecalciferol (VITAMIN D3) 2000 units capsule Take 1 capsule (2,000 Units total) by mouth daily. (Patient not taking: Reported on 01/22/2016) 30 capsule 2   No current facility-administered medications on file prior to visit.     Review of Systems Patient denies any headaches, blurred vision, shortness of breath, chest pain, abdominal pain, problems with bowel movements, urination, or intercourse.  Objective:  BP 109/86   Pulse (!) 105   Ht 5\' 7"  (1.702 m)   Wt 170 lb 12.8 oz (77.5 kg)   LMP 01/12/2016   BMI 26.75 kg/m  Physical Exam  General:  Well developed, well nourished, no acute distress. She is alert and oriented x3. Skin:  Warm and dry Neck:  Midline trachea, no thyromegaly or nodules Cardiovascular: Regular rate and rhythm, no murmur heard Lungs:  Effort normal, all lung fields clear to auscultation bilaterally Breasts:  No dominant palpable mass, retraction, or nipple discharge Abdomen:  Soft, non tender, no hepatosplenomegaly or masses Pelvic:  External genitalia is normal in appearance.  The vagina is normal in appearance. The cervix is bulbous, no CMT.  Thin prep pap is done with HR HPV cotesting. Uterus is felt to be normal size, shape, and  contour.  No adnexal masses or tenderness noted. Extremities:  No swelling or varicosities noted Psych:  She has a normal mood and affect  Assessment:   Healthy well-woman exam Overweight Weight Loss Managment Plan:  Phentermine prescribed and instructed patient how to use. Diet, exercise and water intake discussed with patient. F/U in 1 month for nurse visit for B12/wt check/BP, or sooner if needed F/U in 1 year for AE   Melody Suzan NailerN Shambley, CNM

## 2016-01-22 NOTE — Telephone Encounter (Signed)
I'll prefer to have just one of [place] monitoring the thyroid If Melody would prefer to manage this and feels comfortable doing so, I will be glad to turn this over to her I already signed the TSH result and put it in the fax tray

## 2016-01-22 NOTE — Telephone Encounter (Signed)
She is not adjusting dose just needed to make sure her thyroid was normal before starting her on a weight loss medication

## 2016-01-22 NOTE — Telephone Encounter (Signed)
Patient was calling back about thyroid labs [facility] she is going to start her on a weight loss med.

## 2016-01-22 NOTE — Telephone Encounter (Signed)
Dr. lada received pt TSH lab results from [facility]. Pt was suppose to have the test done in December. Dr. lada would like to know is there a reason she had her TSH and why.

## 2016-01-23 ENCOUNTER — Encounter: Payer: Self-pay | Admitting: Family Medicine

## 2016-01-24 LAB — CYTOLOGY - PAP

## 2016-02-18 ENCOUNTER — Ambulatory Visit (INDEPENDENT_AMBULATORY_CARE_PROVIDER_SITE_OTHER): Payer: Managed Care, Other (non HMO) | Admitting: Obstetrics and Gynecology

## 2016-02-18 VITALS — BP 115/87 | HR 96 | Ht 67.0 in | Wt 169.3 lb

## 2016-02-18 DIAGNOSIS — E663 Overweight: Secondary | ICD-10-CM

## 2016-02-18 MED ORDER — CYANOCOBALAMIN 1000 MCG/ML IJ SOLN
1000.0000 ug | Freq: Once | INTRAMUSCULAR | Status: AC
  Administered 2016-02-18: 1000 ug via INTRAMUSCULAR

## 2016-02-18 NOTE — Progress Notes (Signed)
Patient ID: Melissa Harrison, female   DOB: 12/15/1980, 35 y.o.   MRN: [id number] Pt presents for weight, B/P, B-12 injection. No side effects of medication-Phentermine, or B-12.  Weight loss of _1_ lbs. Encouraged eating healthy and exercise.

## 2016-03-11 ENCOUNTER — Ambulatory Visit (INDEPENDENT_AMBULATORY_CARE_PROVIDER_SITE_OTHER): Payer: Managed Care, Other (non HMO) | Admitting: Family Medicine

## 2016-03-11 ENCOUNTER — Encounter: Payer: Self-pay | Admitting: Family Medicine

## 2016-03-11 DIAGNOSIS — F1590 Other stimulant use, unspecified, uncomplicated: Secondary | ICD-10-CM | POA: Diagnosis not present

## 2016-03-11 DIAGNOSIS — R000 Tachycardia, unspecified: Secondary | ICD-10-CM | POA: Diagnosis not present

## 2016-03-11 DIAGNOSIS — E063 Autoimmune thyroiditis: Secondary | ICD-10-CM | POA: Diagnosis not present

## 2016-03-11 DIAGNOSIS — E038 Other specified hypothyroidism: Secondary | ICD-10-CM | POA: Diagnosis not present

## 2016-03-11 NOTE — Patient Instructions (Addendum)
Let's get labs today If you have not heard anything from my staff in a week about any orders/referrals/studies from today, please contact us here to follow-up [phone] I'll advise no caffeine with the phentermine

## 2016-03-11 NOTE — Assessment & Plan Note (Signed)
Check labs today.

## 2016-03-11 NOTE — Progress Notes (Signed)
BP 103/60 (BP Location: Left Arm, Patient Position: Sitting, Cuff Size: Normal)   Pulse (!) 103   Temp 98.2 F (36.8 C) (Oral)   Resp 14   Wt 168 lb (76.2 kg)   LMP 02/29/2016   SpO2 98%   BMI 26.31 kg/m    Subjective:    Patient ID: Melissa Harrison, female    DOB: Nov 30, 1980, 36 y.o.   MRN: [id number]  HPI: Melissa Harrison is a 36 y.o. female  Chief Complaint  Patient presents with  . Labs Only    Thyroid recheck; Patient had it checked last tim [facility]with [facility] 2 months ago and it was back up   Patient would like her thyroid rechecked She is working with Yolanda Bonine for weight loss, treated with phentermine by that clinic TSH had gone up to 3 range She has noticed bleh mental feeling, not to the point of sluggishness b/c on phentermine Heart rate is 103, on phentermine and drank coffee BMs are normal Skin is dry, but nothing out of the ordinary Scalp is dry, using T-gel No excessive hair loss Anemia a while back, came back to normal; no bleeding She was 166 pounds at her weigh in the other day On antibiotic for "a cold"; place in [place] saw her and prescribed the antibiotic  Depression screen [facility] 2/9 12/24/2015 10/15/2015 08/13/2015 08/17/2014  Decreased Interest 0 0 0 0  Down, Depressed, Hopeless 1 0 0 0  PHQ - 2 Score 1 0 0 0   Relevant past medical, surgical, family and social history reviewed Past Medical History:  Diagnosis Date  . Allergy   . Anemia   . Headache   . Hypothyroidism 08/14/2015  . Hypothyroidism due to Hashimoto's thyroiditis 09/07/2015  . Iron deficiency anemia 08/14/2015  . Uses central nervous system stimulants 03/12/2016  . Vitamin D deficiency 08/14/2015   Family History  Problem Relation Age of Onset  . Cancer Father     LUNG  . Cancer Paternal Grandmother     breast cancer  . Diabetes Sister   . Diabetes Brother   . Heart disease Brother     from his diabetes, heart attack  . Hypertension Brother   . Cancer Maternal Aunt    UTERINE   Social History  Substance Use Topics  . Smoking status: Former Games developer  . Smokeless tobacco: Never Used  . Alcohol use 0.0 oz/week     Comment: an ocassional glass of wine   Interim medical history since last visit reviewed. Allergies and medications reviewed  Review of Systems Per HPI unless specifically indicated above     Objective:    BP 103/60 (BP Location: Left Arm, Patient Position: Sitting, Cuff Size: Normal)   Pulse (!) 103   Temp 98.2 F (36.8 C) (Oral)   Resp 14   Wt 168 lb (76.2 kg)   LMP 02/29/2016   SpO2 98%   BMI 26.31 kg/m   Wt Readings from Last 3 Encounters:  03/11/16 168 lb (76.2 kg)  02/18/16 169 lb 4.8 oz (76.8 kg)  01/22/16 170 lb 12.8 oz (77.5 kg)    Physical Exam  Constitutional: She appears well-developed and well-nourished.  Weight loss 2 pounds over last 6 weeks  HENT:  Mouth/Throat: Mucous membranes are normal.  Eyes: EOM are normal. No scleral icterus.  Neck: No thyroid mass and no thyromegaly present.  Cardiovascular: Regular rhythm.  Tachycardia present.   Pulmonary/Chest: Effort normal and breath sounds normal.  Musculoskeletal: She exhibits  no edema.  Neurological: She displays no tremor.  Trace patellar DTRs  Psychiatric: She has a normal mood and affect. Her behavior is normal. Her mood appears not anxious. Her speech is not rapid and/or pressured. She is not agitated and not hyperactive. She does not exhibit a depressed mood.   Results for orders placed or performed in visit on 03/11/16  T4, free  Result Value Ref Range   Free T4 1.2 0.8 - 1.8 ng/dL  T3, free  Result Value Ref Range   T3, Free 2.9 2.3 - 4.2 pg/mL  TSH  Result Value Ref Range   TSH 1.77 mIU/L      Assessment & Plan:   Problem List Items Addressed This Visit      Endocrine   Hypothyroidism due to Hashimoto's thyroiditis    Check labs today      Relevant Orders   T4, free (Completed)   T3, free (Completed)   TSH (Completed)     Other    Uses central nervous system stimulants    Cautioned her about use; avoid caffeine, other stimulants; I do NOT prescribe phentermine; this is through another clinic      Tachycardia    Likely secondary to phentermine use; cautioned patient; I would suggest that she not use any other stimulants or drink coffee, but she can talk to the prescriber          Follow up plan: No Follow-up on file.  An after-visit summary was printed and given to the patient at check-out.  Please see the patient instructions which may contain other information and recommendations beyond what is mentioned above in the assessment and plan.  Meds ordered this encounter  Medications  . sulfamethoxazole-trimethoprim (BACTRIM DS,SEPTRA DS) 800-160 MG tablet    Orders Placed This Encounter  Procedures  . T4, free  . T3, free  . TSH

## 2016-03-12 ENCOUNTER — Encounter: Payer: Self-pay | Admitting: Family Medicine

## 2016-03-12 DIAGNOSIS — F1590 Other stimulant use, unspecified, uncomplicated: Secondary | ICD-10-CM

## 2016-03-12 DIAGNOSIS — R000 Tachycardia, unspecified: Secondary | ICD-10-CM | POA: Insufficient documentation

## 2016-03-12 HISTORY — DX: Other stimulant use, unspecified, uncomplicated: F15.90

## 2016-03-12 LAB — TSH: TSH: 1.77 mIU/L

## 2016-03-12 LAB — T4, FREE: Free T4: 1.2 ng/dL (ref 0.8–1.8)

## 2016-03-12 LAB — T3, FREE: T3, Free: 2.9 pg/mL (ref 2.3–4.2)

## 2016-03-12 NOTE — Assessment & Plan Note (Signed)
Cautioned her about use; avoid caffeine, other stimulants; I do NOT prescribe phentermine; this is through another clinic

## 2016-03-12 NOTE — Assessment & Plan Note (Signed)
Likely secondary to phentermine use; cautioned patient; I would suggest that she not use any other stimulants or drink coffee, but she can talk to the prescriber

## 2016-03-14 ENCOUNTER — Encounter: Payer: Self-pay | Admitting: Family Medicine

## 2016-03-18 ENCOUNTER — Other Ambulatory Visit: Payer: Self-pay | Admitting: Family Medicine

## 2016-03-18 ENCOUNTER — Ambulatory Visit (INDEPENDENT_AMBULATORY_CARE_PROVIDER_SITE_OTHER): Payer: Managed Care, Other (non HMO) | Admitting: Obstetrics and Gynecology

## 2016-03-18 VITALS — BP 120/82 | HR 109 | Wt 163.8 lb

## 2016-03-18 DIAGNOSIS — E663 Overweight: Secondary | ICD-10-CM

## 2016-03-18 MED ORDER — CYANOCOBALAMIN 1000 MCG/ML IJ SOLN
1000.0000 ug | Freq: Once | INTRAMUSCULAR | Status: AC
  Administered 2016-03-18: 1000 ug via INTRAMUSCULAR

## 2016-03-18 NOTE — Progress Notes (Signed)
Pt is here for wt, bp check, b- 12 inj She is doing great with her weight loss, denies any s/e   02/18/16 wt- 169lb 01/22/16 wt- 170lb

## 2016-03-20 ENCOUNTER — Other Ambulatory Visit: Payer: Self-pay

## 2016-03-20 MED ORDER — LEVOTHYROXINE SODIUM 112 MCG PO TABS: 112.0000 ug | ORAL_TABLET | ORAL | 0 refills | Status: DC

## 2016-03-20 NOTE — Telephone Encounter (Signed)
Patient had labs done. She is completley out and spoke with someone yesterday about getting it refill. Whoever she spoke to stated they will take care of it but I don't see it has been. She has to take it in the morning  so patient would need to pick up tonight.

## 2016-04-16 ENCOUNTER — Ambulatory Visit (INDEPENDENT_AMBULATORY_CARE_PROVIDER_SITE_OTHER): Payer: Managed Care, Other (non HMO) | Admitting: Obstetrics and Gynecology

## 2016-04-16 ENCOUNTER — Encounter: Payer: Self-pay | Admitting: Obstetrics and Gynecology

## 2016-04-16 VITALS — BP 130/90 | HR 89 | Wt 164.8 lb

## 2016-04-16 DIAGNOSIS — E663 Overweight: Secondary | ICD-10-CM | POA: Diagnosis not present

## 2016-04-16 DIAGNOSIS — Z30011 Encounter for initial prescription of contraceptive pills: Secondary | ICD-10-CM | POA: Diagnosis not present

## 2016-04-16 MED ORDER — PHENTERMINE HCL 37.5 MG PO TABS: 37.5000 mg | ORAL_TABLET | Freq: Every day | ORAL | 2 refills | Status: DC

## 2016-04-16 MED ORDER — DESOGESTREL-ETHINYL ESTRADIOL 0.15-0.02/0.01 MG (21/5) PO TABS: 1.0000 | ORAL_TABLET | Freq: Every day | ORAL | 11 refills | Status: DC

## 2016-04-16 NOTE — Progress Notes (Signed)
SUBJECTIVE:  36 y.o. here for follow-up weight loss visit, previously seen 4 weeks ago. Denies any concerns and feels like medication is working well, desires to continue on it. Also wants to restart BCP.  OBJECTIVE:  BP 130/90   Pulse 89   Wt 164 lb 12.8 oz (74.8 kg)   BMI 25.81 kg/m   Body mass index is 25.81 kg/m. Patient appears well. ASSESSMENT:  Overweight- responding well to weight loss plan Restart contraception PLAN:  To continue with current medications. B12 1045mg/ml injection given RTC in 6 weeks as planned mircette OCPs started.  Melissa Harrison [place] CNM

## 2016-04-22 ENCOUNTER — Other Ambulatory Visit: Payer: Self-pay | Admitting: Obstetrics and Gynecology

## 2016-04-24 ENCOUNTER — Telehealth: Payer: Self-pay | Admitting: Obstetrics and Gynecology

## 2016-04-24 NOTE — Telephone Encounter (Signed)
Pt called and said she needs pre auth on her wt loss visits. Holland Falling is asking for pre auth on her last visits and retro date it please and also ask for auth for 1 yr

## 2016-05-08 ENCOUNTER — Telehealth: Payer: Self-pay | Admitting: Obstetrics and Gynecology

## 2016-05-08 NOTE — Telephone Encounter (Signed)
Notified pt. 

## 2016-05-08 NOTE — Telephone Encounter (Signed)
PT CALLED AND SHE HAS A QUESTION ABOUT THE BC SHE IS ON, SHE IS NT SURE IF SHE NEEDS TO TAKE THE LAST WEEK OF THE BC, [place] WOULD LIKE A CALL BACK, SHE CANT REMEMBER EXACTLY WHAT THE NAME OF IT IS, SHE SAID YOU CAN LEAVE A MESSAGE IF DOES NOT ANSWER.

## 2016-05-28 ENCOUNTER — Encounter: Payer: Self-pay | Admitting: Family Medicine

## 2016-05-28 ENCOUNTER — Other Ambulatory Visit: Payer: Self-pay | Admitting: Family Medicine

## 2016-05-28 ENCOUNTER — Ambulatory Visit: Payer: Managed Care, Other (non HMO) | Admitting: Obstetrics and Gynecology

## 2016-05-29 ENCOUNTER — Encounter: Payer: Self-pay | Admitting: Family Medicine

## 2016-05-29 MED ORDER — LEVOTHYROXINE SODIUM 112 MCG PO TABS: 112.0000 ug | ORAL_TABLET | ORAL | 1 refills | Status: DC

## 2016-05-29 NOTE — Telephone Encounter (Signed)
Last TSH reviewed Rx approved 

## 2016-06-04 ENCOUNTER — Ambulatory Visit (INDEPENDENT_AMBULATORY_CARE_PROVIDER_SITE_OTHER): Payer: Managed Care, Other (non HMO) | Admitting: Obstetrics and Gynecology

## 2016-06-04 ENCOUNTER — Encounter: Payer: Self-pay | Admitting: Obstetrics and Gynecology

## 2016-06-04 VITALS — BP 126/84 | HR 90 | Wt 160.3 lb

## 2016-06-04 DIAGNOSIS — E663 Overweight: Secondary | ICD-10-CM

## 2016-06-04 NOTE — Progress Notes (Signed)
Pt is here for wt, bp check, b-12 inj She is doing well, denies any s/e, she doesn't want to take the b-12 inj, doesn't feel as if it helps her    06/04/16 wt- 160.3lb 04/16/16 wt- 164lb 03/18/16 wt- 163

## 2016-06-30 ENCOUNTER — Other Ambulatory Visit: Payer: Self-pay | Admitting: *Deleted

## 2016-06-30 ENCOUNTER — Encounter: Payer: Self-pay | Admitting: Obstetrics and Gynecology

## 2016-06-30 MED ORDER — DESOGESTREL-ETHINYL ESTRADIOL 0.15-0.02/0.01 MG (21/5) PO TABS: 1.0000 | ORAL_TABLET | Freq: Every day | ORAL | 6 refills | Status: DC

## 2016-07-07 ENCOUNTER — Telehealth: Payer: Self-pay | Admitting: Family Medicine

## 2016-07-07 ENCOUNTER — Other Ambulatory Visit: Payer: Self-pay

## 2016-07-07 MED ORDER — SCOPOLAMINE 1 MG/3DAYS TD PT72
1.0000 | MEDICATED_PATCH | TRANSDERMAL | 1 refills | Status: DC
Start: 2016-07-07 — End: 2016-07-14

## 2016-07-07 NOTE — Telephone Encounter (Signed)
Rx sent 

## 2016-07-07 NOTE — Telephone Encounter (Signed)
Pt is going on a [place] fishing trip and is asking for a refill on the motion sickness patch (at least 4). Please send to [facility] [address]

## 2016-07-14 ENCOUNTER — Encounter: Payer: Self-pay | Admitting: Family Medicine

## 2016-07-14 ENCOUNTER — Ambulatory Visit (INDEPENDENT_AMBULATORY_CARE_PROVIDER_SITE_OTHER): Payer: Managed Care, Other (non HMO) | Admitting: Family Medicine

## 2016-07-14 VITALS — BP 118/78 | HR 96 | Temp 97.7°F | Resp 14 | Wt 159.0 lb

## 2016-07-14 DIAGNOSIS — E038 Other specified hypothyroidism: Secondary | ICD-10-CM

## 2016-07-14 DIAGNOSIS — D509 Iron deficiency anemia, unspecified: Secondary | ICD-10-CM

## 2016-07-14 DIAGNOSIS — I739 Peripheral vascular disease, unspecified: Secondary | ICD-10-CM

## 2016-07-14 DIAGNOSIS — E063 Autoimmune thyroiditis: Secondary | ICD-10-CM

## 2016-07-14 DIAGNOSIS — I999 Unspecified disorder of circulatory system: Secondary | ICD-10-CM

## 2016-07-14 LAB — TSH: TSH: 4.5 mIU/L

## 2016-07-14 LAB — COMPLETE METABOLIC PANEL WITH GFR
ALT: 15 U/L (ref 6–29)
AST: 15 U/L (ref 10–30)
Albumin: 4.2 g/dL (ref 3.6–5.1)
Alkaline Phosphatase: 44 U/L (ref 33–115)
BUN: 12 mg/dL (ref 7–25)
CO2: 24 mmol/L (ref 20–31)
Calcium: 9.3 mg/dL (ref 8.6–10.2)
Chloride: 102 mmol/L (ref 98–110)
Creat: 0.69 mg/dL (ref 0.50–1.10)
GFR, Est African American: >89 mL/min (ref >=60)
GFR, Est Non African American: >89 mL/min (ref >=60)
Glucose, Bld: 89 mg/dL (ref 65–99)
Potassium: 4.3 mmol/L (ref 3.5–5.3)
Sodium: 138 mmol/L (ref 135–146)
Total Bilirubin: 0.2 mg/dL (ref 0.2–1.2)
Total Protein: 7.1 g/dL (ref 6.1–8.1)

## 2016-07-14 LAB — CBC WITH DIFFERENTIAL/PLATELET
Basophils Absolute: 0 cells/uL (ref 0–200)
Basophils Relative: 0 %
Eosinophils Absolute: 425 cells/uL (ref 15–500)
Eosinophils Relative: 5 %
HCT: 40.9 % (ref 35.0–45.0)
Hemoglobin: 13.6 g/dL (ref 11.7–15.5)
Lymphocytes Relative: 26 %
Lymphs Abs: 2210 cells/uL (ref 850–3900)
MCH: 28.8 pg (ref 27.0–33.0)
MCHC: 33.3 g/dL (ref 32.0–36.0)
MCV: 86.5 fL (ref 80.0–100.0)
MPV: 9.9 fL (ref 7.5–12.5)
Monocytes Absolute: 680 cells/uL (ref 200–950)
Monocytes Relative: 8 %
Neutro Abs: 5185 cells/uL (ref 1500–7800)
Neutrophils Relative %: 61 %
Platelets: 303 10*3/uL (ref 140–400)
RBC: 4.73 MIL/uL (ref 3.80–5.10)
RDW: 13.5 % (ref 11.0–15.0)
WBC: 8.5 10*3/uL (ref 3.8–10.8)

## 2016-07-14 NOTE — Patient Instructions (Signed)
Stop the phentermine Check out the information at familydoctor.org entitled "Nutrition for Weight Loss: What You Need to Know about Fad Diets" Try to lose between 1-2 pounds per week by taking in fewer calories and burning off more calories You can succeed by limiting portions, limiting foods dense in calories and fat, becoming more active, and drinking 8 glasses of water a day (64 ounces) Don't skip meals, especially breakfast, as skipping meals may alter your metabolism Do not use over-the-counter weight loss pills or gimmicks that claim rapid weight loss A healthy BMI (or body mass index) is between 18.5 and 24.9 You can calculate your ideal BMI at the [place] website ClubMonetize.fr Let me know after two weeks how you are doing

## 2016-07-14 NOTE — Assessment & Plan Note (Signed)
Check tsh 

## 2016-07-14 NOTE — Progress Notes (Signed)
BP 118/78   Pulse 96   Temp 97.7 F (36.5 C) (Oral)   Resp 14   Wt 159 lb (72.1 kg)   LMP  (LMP Unknown)   SpO2 98%   BMI 24.90 kg/m    Subjective:    Patient ID: Melissa Harrison, female    DOB: 02-15-1981, 36 y.o.   MRN: [id number]  HPI: Melissa Harrison is a 36 y.o. female  Chief Complaint  Patient presents with  . Nose Problem    Patient states it gets hot and cold also finjgers and toes   HPI Patient is here for an acute visit Nose gets red and hot; 30% of the time that fingers are involved Going on since February; not a big deal then says not to ignore Mostly the fingers and sometimes toes Not painful Paid really close attention Thought maybe allergic to the soap Thought maybe gluten Went a whole day without eating gluten and it happened that night Not after alcohol Not with cold temperatures She found the phrase raynaud's, but no one in fam to her knowledge No recent fevers No travel On phentermine since November I specifically asked if the symptoms were happening later in the day as the phentermine was wearing off and she wasn't sure, but then thought maybe No palpitations No chest pain  Depression screen [facility] 2/9 07/14/2016 12/24/2015 10/15/2015 08/13/2015 08/17/2014  Decreased Interest 0 0 0 0 0  Down, Depressed, Hopeless 0 1 0 0 0  PHQ - 2 Score 0 1 0 0 0   Relevant past medical, surgical, family and social history reviewed Past Medical History:  Diagnosis Date  . Allergy   . Anemia   . Headache   . Hypothyroidism 08/14/2015  . Hypothyroidism due to Hashimoto's thyroiditis 09/07/2015  . Iron deficiency anemia 08/14/2015  . Uses central nervous system stimulants 03/12/2016  . Vitamin D deficiency 08/14/2015   Past Surgical History:  Procedure Laterality Date  . CESAREAN SECTION     family history includes Asthma in her daughter; Cancer in her father, maternal aunt, paternal grandfather, and paternal grandmother; Diabetes in her brother and sister; Eczema  in her daughter; Heart attack in her maternal grandfather; Heart disease in her brother; Hypertension in her brother; Kidney disease in her brother; Thyroid disease in her mother. Social History   Social History  . Marital status: Married    Spouse name: N/A  . Number of children: 1  . Years of education: 69   Occupational History  . Property Manager     [place] History Main Topics  . Smoking status: Former Research scientist (life sciences)  . Smokeless tobacco: Never Used  . Alcohol use 0.0 oz/week     Comment: an ocassional glass of wine  . Drug use: No  . Sexual activity: Yes    Partners: Male    Birth control/ protection: None   Other Topics Concern  . Not on file   Social History Narrative  . No narrative on file   Interim medical history since last visit reviewed. Allergies and medications reviewed  Review of Systems Per HPI unless specifically indicated above     Objective:    BP 118/78   Pulse 96   Temp 97.7 F (36.5 C) (Oral)   Resp 14   Wt 159 lb (72.1 kg)   LMP  (LMP Unknown)   SpO2 98%   BMI 24.90 kg/m   Wt Readings from Last 3 Encounters:  07/14/16 159  lb (72.1 kg)  06/04/16 160 lb 4.8 oz (72.7 kg)  04/16/16 164 lb 12.8 oz (74.8 kg)    Physical Exam  Constitutional: She appears well-developed and well-nourished. No distress.  Cardiovascular: Normal rate and regular rhythm.   Pulses:      Radial pulses are 2+ on the right side, and 2+ on the left side.  Ulnar pulses 2+ bilaterally; capillar refill of feet delayed perhaps; no violacous hue, and feet were warm  Pulmonary/Chest: Effort normal and breath sounds normal.  Musculoskeletal: She exhibits no edema.  Skin: No rash noted. No erythema. No pallor.  No abnormal coloring of the nose  Psychiatric: She has a normal mood and affect. Her mood appears not anxious. She does not exhibit a depressed mood.      Assessment & Plan:   Problem List Items Addressed This Visit      Endocrine   Hypothyroidism due  to Hashimoto's thyroiditis    Check tsh      Relevant Orders   TSH (Completed)     Other   Microcytic hypochromic anemia   Relevant Orders   CBC with Differential/Platelet (Completed)    Other Visit Diagnoses    Peripheral vasodilation ([place])    -  Primary   I suspect the phentermine is responsible, with dilatation as med wears off; I strongly urged her to STOP phentermine, see how this does   Relevant Orders   ANA,IFA RA Diag Pnl w/rflx Tit/Patn (Completed)   COMPLETE METABOLIC PANEL WITH GFR (Completed)   Circulation problem       will stop phentermine first, see if problem resolves; if not, can do additional testing for vasculitis, etc.       Follow up plan: No Follow-up on file.  An after-visit summary was printed and given to the patient at [place].  Please see the patient instructions which may contain other information and recommendations beyond what is mentioned above in the assessment and plan.  No orders of the defined types were placed in this encounter.   Orders Placed This Encounter  Procedures  . TSH  . CBC with Differential/Platelet  . ANA,IFA RA Diag Pnl w/rflx Tit/Patn  . COMPLETE METABOLIC PANEL WITH GFR

## 2016-07-15 LAB — ANA,IFA RA DIAG PNL W/RFLX TIT/PATN
Anti Nuclear Antibody(ANA): NEGATIVE
Cyclic Citrullin Peptide Ab: 51 Units — ABNORMAL HIGH
Rhuematoid fact SerPl-aCnc: <14 IU/mL (ref <14)

## 2016-07-16 ENCOUNTER — Encounter: Payer: Self-pay | Admitting: Family Medicine

## 2016-07-16 DIAGNOSIS — E038 Other specified hypothyroidism: Secondary | ICD-10-CM

## 2016-07-16 DIAGNOSIS — E063 Autoimmune thyroiditis: Principal | ICD-10-CM

## 2016-07-16 MED ORDER — LEVOTHYROXINE SODIUM 112 MCG PO TABS: 112.0000 ug | ORAL_TABLET | Freq: Every day | ORAL | 0 refills | Status: DC

## 2016-07-16 NOTE — Telephone Encounter (Signed)
Changed to 112 mcg daily; recheck TSH in 6-8 weeks, orders entered, Rx sent

## 2016-08-18 ENCOUNTER — Other Ambulatory Visit: Payer: Self-pay

## 2016-08-18 DIAGNOSIS — E038 Other specified hypothyroidism: Secondary | ICD-10-CM

## 2016-08-18 DIAGNOSIS — E063 Autoimmune thyroiditis: Principal | ICD-10-CM

## 2016-08-19 LAB — TSH: TSH: 3.52 mIU/L

## 2016-08-20 ENCOUNTER — Other Ambulatory Visit: Payer: Self-pay | Admitting: Family Medicine

## 2016-08-20 ENCOUNTER — Encounter: Payer: Self-pay | Admitting: Family Medicine

## 2016-08-20 DIAGNOSIS — E063 Autoimmune thyroiditis: Principal | ICD-10-CM

## 2016-08-20 DIAGNOSIS — E038 Other specified hypothyroidism: Secondary | ICD-10-CM

## 2016-08-20 NOTE — Progress Notes (Signed)
Disregard; refill not needed

## 2016-08-21 MED ORDER — LEVOTHYROXINE SODIUM 125 MCG PO TABS: 125.0000 ug | ORAL_TABLET | ORAL | 0 refills | Status: DC

## 2016-08-21 MED ORDER — LEVOTHYROXINE SODIUM 112 MCG PO TABS: 112.0000 ug | ORAL_TABLET | ORAL | 0 refills | Status: DC

## 2016-08-31 ENCOUNTER — Encounter: Payer: Self-pay | Admitting: Obstetrics and Gynecology

## 2016-09-03 ENCOUNTER — Ambulatory Visit (INDEPENDENT_AMBULATORY_CARE_PROVIDER_SITE_OTHER): Payer: Managed Care, Other (non HMO) | Admitting: Obstetrics and Gynecology

## 2016-09-03 ENCOUNTER — Encounter: Payer: Self-pay | Admitting: Obstetrics and Gynecology

## 2016-09-03 VITALS — BP 113/84 | HR 86 | Ht 67.5 in | Wt 163.7 lb

## 2016-09-03 DIAGNOSIS — N921 Excessive and frequent menstruation with irregular cycle: Secondary | ICD-10-CM

## 2016-09-03 NOTE — Progress Notes (Signed)
Subjective:     Patient ID: Melissa Harrison, female   DOB: May 18, 1980, 36 y.o.   MRN: [id number]  HPI Reports daily spotting x 5 weeks, dark brown blood, no pain noted. Taking OCPs consistently and daily omitting placebo pills since November. Did increase thyroid medicine per PCP. Denies any bowel changes, but does report increase in urine odor for last 2 weeks.   Review of Systems Negative except stated above in HPI    Objective:   Physical Exam A&Ox4 Well groomed female in no distress Blood pressure 113/84, pulse 86, height 5' 7.5" (1.715 m), weight 163 lb 11.2 oz (74.3 kg). UPT- Urinalysis    Component Value Date/Time   COLORURINE YELLOW (A) 06/07/2015 1349   APPEARANCEUR CLEAR (A) 06/07/2015 1349   LABSPEC 1.016 06/07/2015 1349   PHURINE 6.0 06/07/2015 1349   GLUCOSEU NEGATIVE 06/07/2015 1349   HGBUR NEGATIVE 06/07/2015 1349   BILIRUBINUR NEGATIVE 06/07/2015 1349   KETONESUR TRACE (A) 06/07/2015 1349   PROTEINUR NEGATIVE 06/07/2015 1349   NITRITE NEGATIVE 06/07/2015 1349   LEUKOCYTESUR NEGATIVE 06/07/2015 1349   Pelvic exam: normal external genitalia, vulva, vagina, cervix, uterus and adnexa, small amount dark red old blood noted at cervix.     Assessment:     BTB on OCPs    Plan:     Recommend stopping pills for 4 days every 3-4 months to allow sheding of lining as it should fix and then prevent BTB. Reassured of normal findings. RTC as needed.  Melody [place], CNM

## 2016-09-07 ENCOUNTER — Other Ambulatory Visit: Payer: Self-pay

## 2016-09-07 ENCOUNTER — Encounter: Payer: Self-pay | Admitting: Family Medicine

## 2016-09-07 NOTE — Telephone Encounter (Signed)
I think there has been a mis understanding with patient but she has only been taking the thyroid med daily and now needs a refill. Please advise?

## 2016-09-07 NOTE — Telephone Encounter (Deleted)
Recent TSH WNL. Refill provided

## 2016-09-08 NOTE — Telephone Encounter (Signed)
See f/u note on July 2nd; patient is taking every other day 3 month supply Rxd in June, so this isn't needed now until Sept

## 2016-09-25 ENCOUNTER — Ambulatory Visit: Payer: Self-pay | Admitting: Family Medicine

## 2016-09-28 ENCOUNTER — Encounter: Payer: Self-pay | Admitting: Family Medicine

## 2016-09-28 ENCOUNTER — Telehealth: Payer: Self-pay

## 2016-09-28 ENCOUNTER — Ambulatory Visit (INDEPENDENT_AMBULATORY_CARE_PROVIDER_SITE_OTHER): Payer: Managed Care, Other (non HMO) | Admitting: Family Medicine

## 2016-09-28 VITALS — BP 122/72 | HR 94 | Temp 98.8°F | Resp 14 | Wt 161.0 lb

## 2016-09-28 DIAGNOSIS — K5900 Constipation, unspecified: Secondary | ICD-10-CM | POA: Diagnosis not present

## 2016-09-28 DIAGNOSIS — E038 Other specified hypothyroidism: Secondary | ICD-10-CM | POA: Diagnosis not present

## 2016-09-28 DIAGNOSIS — K625 Hemorrhage of anus and rectum: Secondary | ICD-10-CM

## 2016-09-28 DIAGNOSIS — R140 Abdominal distension (gaseous): Secondary | ICD-10-CM

## 2016-09-28 DIAGNOSIS — E063 Autoimmune thyroiditis: Secondary | ICD-10-CM

## 2016-09-28 LAB — CBC WITH DIFFERENTIAL/PLATELET
Basophils Absolute: 96 cells/uL (ref 0–200)
Basophils Relative: 1 %
Eosinophils Absolute: 384 cells/uL (ref 15–500)
Eosinophils Relative: 4 %
HCT: 40.4 % (ref 35.0–45.0)
Hemoglobin: 13.1 g/dL (ref 11.7–15.5)
Lymphocytes Relative: 16 %
Lymphs Abs: 1536 cells/uL (ref 850–3900)
MCH: 29.2 pg (ref 27.0–33.0)
MCHC: 32.4 g/dL (ref 32.0–36.0)
MCV: 90.2 fL (ref 80.0–100.0)
MPV: 9.7 fL (ref 7.5–12.5)
Monocytes Absolute: 576 cells/uL (ref 200–950)
Monocytes Relative: 6 %
Neutro Abs: 7008 cells/uL (ref 1500–7800)
Neutrophils Relative %: 73 %
Platelets: 341 10*3/uL (ref 140–400)
RBC: 4.48 MIL/uL (ref 3.80–5.10)
RDW: 12.7 % (ref 11.0–15.0)
WBC: 9.6 10*3/uL (ref 3.8–10.8)

## 2016-09-28 NOTE — Telephone Encounter (Signed)
I would not recommend an ultrasound for her symptoms I can order a AAS and she can have that done at the outpatient center Thank you

## 2016-09-28 NOTE — Patient Instructions (Addendum)
Try to get 64 ounces of water a day Try to get 25 or more grams of fiber a day We'll refer you to the gastroenterologist If you have not heard anything from my staff in a week about any orders/referrals/studies from today, please contact us here to follow-up [phone]  Rectal Bleeding Rectal bleeding is when blood comes out of the opening of the butt (anus). People with this kind of bleeding may notice bright red blood in their underwear or in the toilet after they poop (have a bowel movement). They may also have dark red or black poop (stool). Rectal bleeding is often a sign that something is wrong. It needs to be checked by a doctor. Follow these instructions at home: Watch for any changes in your condition. Take these actions to help with bleeding and discomfort:  Eat a diet that is high in fiber. This will keep your poop soft so it is easier for you to poop without pushing too hard. Ask your doctor to tell you what foods and drinks are high in fiber.  Drink enough fluid to keep your pee (urine) clear or pale yellow. This also helps keep your poop soft.  Try taking a warm bath. This may help with pain.  Keep all follow-up visits as told by your doctor. This is important.  Get help right away if:  You have new bleeding.  You have more bleeding than before.  You have black or dark red poop.  You throw up (vomit) blood or something that looks like coffee grounds.  You have pain or tenderness in your belly (abdomen).  You have a fever.  You feel weak.  You feel sick to your stomach (nauseous).  You pass out (faint).  You have very bad pain in your butt.  You cannot poop. This information is not intended to replace advice given to you by your health care provider. Make sure you discuss any questions you have with your health care provider. Document Released: 11/05/2010 Document Revised: 08/01/2015 Document Reviewed: 04/21/2015 Elsevier Interactive Patient Education   [employer].

## 2016-09-28 NOTE — Assessment & Plan Note (Signed)
Last TSH reviewed; normal, patient did not feel it was necessary to recheck that today

## 2016-09-28 NOTE — Telephone Encounter (Signed)
Apponitment with GI doctor is a month out and she is concern that she might having something going on. She is asking if she can get US done so she want be on edge and really concern before her GI appt.

## 2016-09-28 NOTE — Assessment & Plan Note (Signed)
Order AAS

## 2016-09-28 NOTE — Progress Notes (Signed)
BP 122/72   Pulse 94   Temp 98.8 F (37.1 C) (Oral)   Resp 14   Wt 161 lb (73 kg)   SpO2 99%   BMI 24.84 kg/m    Subjective:    Patient ID: Melissa Harrison, female    DOB: 1980-06-24, 36 y.o.   MRN: [id number]  HPI: Melissa Harrison is a 36 y.o. female  Chief Complaint  Patient presents with  . Constipation    has tried to get more fiber    HPI Patient is here for an evaluation; c/o constipation She backed up a year, then a few years before that Her first experience with constipation was during pregnancy, BRBPR with straining; on iron pills then Fast forward and she had a smear a year ago for the same thing Her husband is on a keto diet, so they are not eating any fruits at their house She googled colon cancer symptoms; bloated, constipated but alternating with diarrhea Looking at her stools, not sure if more narrow She has seen blood in her stool, after straining; same bright red color when she was pregnant No one in the family with colon cancer Drinking enough water Maybe a little bit mucous Feeling a little bloated Appetite is good Not fatigue No belly pain No night sweats She has been on the internet and is worried about colon cancer Last thyroid check was 08/18/16; stay the course at 125 alt 112 mcg daily; she does not feel like that needs to be rechecked  Depression screen [facility] 2/9 09/28/2016 07/14/2016 12/24/2015 10/15/2015 08/13/2015  Decreased Interest 0 0 0 0 0  Down, Depressed, Hopeless 0 0 1 0 0  PHQ - 2 Score 0 0 1 0 0   Relevant past medical, surgical, family and social history reviewed Past Medical History:  Diagnosis Date  . Allergy   . Anemia   . Headache   . Hypothyroidism 08/14/2015  . Hypothyroidism due to Hashimoto's thyroiditis 09/07/2015  . Iron deficiency anemia 08/14/2015  . Uses central nervous system stimulants 03/12/2016  . Vitamin D deficiency 08/14/2015   Past Surgical History:  Procedure Laterality Date  . CESAREAN SECTION     Family  History  Problem Relation Age of Onset  . Cancer Father        LUNG  . Thyroid disease Mother   . Cancer Paternal Grandmother        breast cancer  . Diabetes Sister   . Diabetes Brother   . Heart disease Brother        from his diabetes, heart attack  . Hypertension Brother   . Kidney disease Brother   . Cancer Maternal Aunt        UTERINE  . Eczema Daughter   . Asthma Daughter   . Heart attack Maternal Grandfather   . Cancer Paternal Grandfather        lung   Social History   Social History  . Marital status: Married    Spouse name: N/A  . Number of children: 1  . Years of education: 4   Occupational History  . Property Manager     [place] History Main Topics  . Smoking status: Former Research scientist (life sciences)  . Smokeless tobacco: Never Used  . Alcohol use 0.0 oz/week     Comment: an ocassional glass of wine  . Drug use: No  . Sexual activity: Yes    Partners: Male    Birth control/ protection: None  Other Topics Concern  . Not on file   Social History Narrative  . No narrative on file   Interim medical history since last visit reviewed. Allergies and medications reviewed  Review of Systems Per HPI unless specifically indicated above     Objective:    BP 122/72   Pulse 94   Temp 98.8 F (37.1 C) (Oral)   Resp 14   Wt 161 lb (73 kg)   SpO2 99%   BMI 24.84 kg/m   Wt Readings from Last 3 Encounters:  09/28/16 161 lb (73 kg)  09/03/16 163 lb 11.2 oz (74.3 kg)  07/14/16 159 lb (72.1 kg)    Physical Exam  Constitutional: She appears well-developed and well-nourished.  HENT:  Mouth/Throat: Mucous membranes are normal.  Eyes: EOM are normal. No scleral icterus.  Cardiovascular: Normal rate and regular rhythm.   Pulmonary/Chest: Effort normal and breath sounds normal.  Abdominal: Soft. Bowel sounds are normal. She exhibits no distension and no mass. There is no tenderness. There is no guarding.  Psychiatric: She has a normal mood and affect. Her  behavior is normal.    Results for orders placed or performed in visit on 08/18/16  TSH  Result Value Ref Range   TSH 3.52 mIU/L      Assessment & Plan:   Problem List Items Addressed This Visit      Endocrine   Hypothyroidism due to Hashimoto's thyroiditis    Last TSH reviewed; normal, patient did not feel it was necessary to recheck that today       Other Visit Diagnoses    BRBPR (bright red blood per rectum)    -  Primary   check CBC and refer to GI; likely internal hemorrhoids, but with other symptoms, r/o colon cancer   Relevant Orders   Ambulatory referral to Gastroenterology   CBC with Differential/Platelet   Abdominal bloating       Relevant Orders   Ambulatory referral to Gastroenterology   Constipation, unspecified constipation type       likely related to change in diet at home; encoruaged fiber, hydration, more fruits and veggies; refer to GI; sparing her the xray today, benign exam   Relevant Orders   Ambulatory referral to Gastroenterology       Follow up plan: No Follow-up on file.  An after-visit summary was printed and given to the patient at [facility].  Please see the patient instructions which may contain other information and recommendations beyond what is mentioned above in the assessment and plan.  Meds ordered this encounter  Medications  . Wheat Dextrin (BENEFIBER) POWD    Sig: Per package directions    Refill:  0    Orders Placed This Encounter  Procedures  . CBC with Differential/Platelet  . Ambulatory referral to Gastroenterology

## 2016-09-29 NOTE — Telephone Encounter (Signed)
Left a detail messaged  

## 2016-09-30 ENCOUNTER — Ambulatory Visit
Admission: RE | Admit: 2016-09-30 | Discharge: 2016-09-30 | Disposition: A | Discharge status: Home / Self Care | Payer: Managed Care, Other (non HMO) | Source: Ambulatory Visit | Attending: Family Medicine | Admitting: Family Medicine

## 2016-09-30 DIAGNOSIS — K625 Hemorrhage of anus and rectum: Secondary | ICD-10-CM | POA: Diagnosis not present

## 2016-10-05 ENCOUNTER — Telehealth: Payer: Self-pay | Admitting: Obstetrics and Gynecology

## 2016-10-05 NOTE — Telephone Encounter (Signed)
Patient's insurance doesn't want to cover her BC pills until August 9th - but she will need to start a new packet in 3 days -she only takes the 21 days per pack - could you call in something different? Or what should she do??   Please call

## 2016-10-06 NOTE — Telephone Encounter (Signed)
Left pt detailed message she can come and get sample of pills

## 2016-10-06 NOTE — Telephone Encounter (Signed)
Mel how can we fix this so insurance will cover??

## 2016-10-06 NOTE — Telephone Encounter (Signed)
Melissa Harrison is close to the pills she is on, offer her a sample

## 2016-10-08 ENCOUNTER — Encounter: Payer: Self-pay | Admitting: Obstetrics and Gynecology

## 2016-10-13 NOTE — Telephone Encounter (Signed)
Patient wants a 21 day pill - due to insurance   Please call ASAP

## 2016-10-15 ENCOUNTER — Other Ambulatory Visit: Payer: Self-pay | Admitting: Obstetrics and Gynecology

## 2016-10-26 ENCOUNTER — Other Ambulatory Visit: Payer: Self-pay | Admitting: Family Medicine

## 2016-10-26 ENCOUNTER — Encounter: Payer: Self-pay | Admitting: Family Medicine

## 2016-10-26 ENCOUNTER — Ambulatory Visit (INDEPENDENT_AMBULATORY_CARE_PROVIDER_SITE_OTHER): Payer: Managed Care, Other (non HMO) | Admitting: Family Medicine

## 2016-10-26 DIAGNOSIS — H04123 Dry eye syndrome of bilateral lacrimal glands: Secondary | ICD-10-CM

## 2016-10-26 DIAGNOSIS — E063 Autoimmune thyroiditis: Secondary | ICD-10-CM | POA: Diagnosis not present

## 2016-10-26 DIAGNOSIS — G4733 Obstructive sleep apnea (adult) (pediatric): Secondary | ICD-10-CM | POA: Diagnosis not present

## 2016-10-26 DIAGNOSIS — F411 Generalized anxiety disorder: Secondary | ICD-10-CM | POA: Diagnosis not present

## 2016-10-26 DIAGNOSIS — E038 Other specified hypothyroidism: Secondary | ICD-10-CM | POA: Diagnosis not present

## 2016-10-26 DIAGNOSIS — K625 Hemorrhage of anus and rectum: Secondary | ICD-10-CM | POA: Diagnosis not present

## 2016-10-26 HISTORY — DX: Generalized anxiety disorder: F41.1

## 2016-10-26 LAB — TSH: TSH: 1.24 mIU/L

## 2016-10-26 MED ORDER — ESCITALOPRAM OXALATE 10 MG PO TABS: 10.0000 mg | ORAL_TABLET | Freq: Every day | ORAL | 1 refills | Status: DC

## 2016-10-26 NOTE — Assessment & Plan Note (Signed)
Using Melissa Harrison which is providing significant relief from ocular symptoms related to Hashimoto's

## 2016-10-26 NOTE — Assessment & Plan Note (Signed)
Discussed; start Lexapro; if any nausea, break in half for 4 days, then back to whole pill; call to speak with CMA in 3-4 weeks (I will be away from the office); colleague could talk with her if needed; I am hopeful that she will have success at 10 mg and not need titration any higher; cautioned about risk of mania in persons with undiagnosed bipolar d/o, call if any unusual thoughts, delusions, etc.

## 2016-10-26 NOTE — Assessment & Plan Note (Signed)
Going to see GI doctor soon; she believes symptoms have improved with better diet

## 2016-10-26 NOTE — Patient Instructions (Addendum)
Start the Lexapro (escitalopram) Please call and check in with Melissa Harrison or Melissa Harrison in 3-4 weeks, and just let them know how you're feeling; they can get a provider to talk with you if needed  12 Ways to Curb Anxiety  ?Anxiety is normal human sensation. It is what helped our ancestors survive the pitfalls of the wilderness. Anxiety is defined as experiencing worry or nervousness about an imminent event or something with an uncertain outcome. It is a feeling experienced by most people at some point in their lives. Anxiety can be triggered by a very personal issue, such as the illness of a loved one, or an event of global proportions, such as a refugee crisis. Some of the symptoms of anxiety are:  Feeling restless.  Having a feeling of impending danger.  Increased heart rate.  Rapid breathing. Sweating.  Shaking.  Weakness or feeling tired.  Difficulty concentrating on anything except the current worry.  Insomnia.  Stomach or bowel problems. What can we do about anxiety we may be feeling? There are many techniques to help manage stress and relax. Here are 12 ways you can reduce your anxiety almost immediately: 1. Turn off the constant feed of information. Take a social media sabbatical. Studies have shown that social media directly contributes to social anxiety.  2. Monitor your television viewing habits. Are you watching shows that are also contributing to your anxiety, such as 24-hour news stations? Try watching something else, or better yet, nothing at all. Instead, listen to music, read an inspirational book or practice a hobby. 3. Eat nutritious meals. Also, don't skip meals and keep healthful snacks on hand. Hunger and poor diet contributes to feeling anxious. 4. Sleep. Sleeping on a regular schedule for at least seven to eight hours a night will do wonders for your outlook when you are awake. 5. Exercise. Regular exercise will help rid your body of that anxious energy and help you get more  restful sleep. 6. Try deep (diaphragmatic) breathing. Inhale slowly through your nose for five seconds and exhale through your mouth. 7. Practice acceptance and gratitude. When anxiety hits, accept that there are things out of your control that shouldn't be of immediate concern.  8. Seek out humor. When anxiety strikes, watch a funny video, read jokes or call a friend who makes you laugh. Laughter is healing for our bodies and releases endorphins that are calming. 9. Stay positive. Take the effort to replace negative thoughts with positive ones. Try to see a stressful situation in a positive light. Try to come up with solutions rather than dwelling on the problem. 10. Figure out what triggers your anxiety. Keep a journal and make note of anxious moments and the events surrounding them. This will help you identify triggers you can avoid or even eliminate. 11. Talk to someone. Let a trusted friend, family member or even trained professional know that you are feeling overwhelmed and anxious. Verbalize what you are feeling and why.  12. Volunteer. If your anxiety is triggered by a crisis on a large scale, become an advocate and work to resolve the problem that is causing you unease. Anxiety is often unwelcome and can become overwhelming. If not kept in check, it can become a disorder that could require medical treatment. However, if you take the time to care for yourself and avoid the triggers that make you anxious, you will be able to find moments of relaxation and clarity that make your life much more enjoyable.

## 2016-10-26 NOTE — Assessment & Plan Note (Signed)
On Xiidra for ocular symptoms; will recheck TSH to see if the alternating dose is okay; over-replacement can contribute to anxiety
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
## 2016-10-26 NOTE — Assessment & Plan Note (Signed)
She saw ENT; does not need CPAP; does not want mouth guard; offered help, nothing I can do she says, but she thanked me

## 2016-10-30 ENCOUNTER — Ambulatory Visit: Payer: Managed Care, Other (non HMO) | Admitting: Gastroenterology

## 2016-11-03 ENCOUNTER — Ambulatory Visit: Payer: Self-pay | Admitting: Family Medicine

## 2016-11-09 ENCOUNTER — Other Ambulatory Visit: Payer: Self-pay | Admitting: Family Medicine

## 2016-12-15 ENCOUNTER — Other Ambulatory Visit: Payer: Self-pay

## 2016-12-15 ENCOUNTER — Encounter: Payer: Self-pay | Admitting: Gastroenterology

## 2016-12-15 ENCOUNTER — Ambulatory Visit: Payer: Managed Care, Other (non HMO) | Admitting: Gastroenterology

## 2016-12-21 ENCOUNTER — Other Ambulatory Visit: Payer: Self-pay | Admitting: Family Medicine

## 2017-01-07 ENCOUNTER — Encounter: Payer: Self-pay | Admitting: Family Medicine

## 2017-01-20 ENCOUNTER — Other Ambulatory Visit: Payer: Self-pay | Admitting: Family Medicine

## 2017-01-22 ENCOUNTER — Encounter: Payer: Managed Care, Other (non HMO) | Admitting: Obstetrics and Gynecology

## 2017-02-02 ENCOUNTER — Encounter: Payer: Self-pay | Admitting: Family Medicine

## 2017-02-03 MED ORDER — LORAZEPAM 0.5 MG PO TABS: 0.2500 mg | ORAL_TABLET | Freq: Two times a day (BID) | ORAL | 0 refills | Status: DC | PRN

## 2017-02-03 MED ORDER — SCOPOLAMINE 1 MG/3DAYS TD PT72: 1.0000 | MEDICATED_PATCH | TRANSDERMAL | 0 refills | Status: DC

## 2017-02-05 ENCOUNTER — Other Ambulatory Visit: Payer: Self-pay | Admitting: Family Medicine

## 2017-02-05 NOTE — Telephone Encounter (Signed)
Refill request for thyroid medication: Levothyroxine to [facility].  Last Visit: 10/26/2016  Lab Results  Component Value Date   TSH 1.24 10/26/2016    No Follow up Scheduled

## 2017-02-07 MED ORDER — LEVOTHYROXINE SODIUM 112 MCG PO TABS
112.0000 ug | ORAL_TABLET | ORAL | 1 refills | Status: DC
Start: 2017-02-07 — End: 2017-08-23

## 2017-02-07 NOTE — Telephone Encounter (Signed)
Last note shows patient should be alterating 112 and 125 mcg Note to pharmacy

## 2017-02-26 ENCOUNTER — Encounter: Payer: 59 | Admitting: Obstetrics and Gynecology

## 2017-04-02 ENCOUNTER — Encounter: Payer: Self-pay | Admitting: Family Medicine

## 2017-04-02 ENCOUNTER — Ambulatory Visit: Payer: 59 | Admitting: Family Medicine

## 2017-04-02 ENCOUNTER — Encounter: Payer: Self-pay | Admitting: Neurology

## 2017-04-02 VITALS — BP 122/78 | HR 92 | Temp 98.5°F | Resp 14 | Wt 170.7 lb

## 2017-04-02 DIAGNOSIS — H04123 Dry eye syndrome of bilateral lacrimal glands: Secondary | ICD-10-CM

## 2017-04-02 DIAGNOSIS — R768 Other specified abnormal immunological findings in serum: Secondary | ICD-10-CM

## 2017-04-02 DIAGNOSIS — R42 Dizziness and giddiness: Secondary | ICD-10-CM

## 2017-04-02 DIAGNOSIS — E038 Other specified hypothyroidism: Secondary | ICD-10-CM | POA: Diagnosis not present

## 2017-04-02 DIAGNOSIS — H539 Unspecified visual disturbance: Secondary | ICD-10-CM

## 2017-04-02 DIAGNOSIS — J343 Hypertrophy of nasal turbinates: Secondary | ICD-10-CM | POA: Diagnosis not present

## 2017-04-02 DIAGNOSIS — R6889 Other general symptoms and signs: Secondary | ICD-10-CM

## 2017-04-02 DIAGNOSIS — E559 Vitamin D deficiency, unspecified: Secondary | ICD-10-CM

## 2017-04-02 DIAGNOSIS — E063 Autoimmune thyroiditis: Secondary | ICD-10-CM | POA: Diagnosis not present

## 2017-04-02 DIAGNOSIS — R448 Other symptoms and signs involving general sensations and perceptions: Secondary | ICD-10-CM

## 2017-04-02 MED ORDER — FLUTICASONE PROPIONATE 50 MCG/ACT NA SUSP: 2.0000 | Freq: Every day | NASAL | 6 refills | Status: DC

## 2017-04-02 NOTE — Progress Notes (Signed)
BP 122/78   Pulse 92   Temp 98.5 F (36.9 C) (Oral)   Resp 14   Wt 170 lb 11.2 oz (77.4 kg)   SpO2 96%   BMI 26.34 kg/m    Subjective:    Patient ID: Melissa Harrison, female    DOB: [birth year], 37 y.o.   MRN: [id number]  HPI: Melissa Harrison is a 37 y.o. female  Chief Complaint  Patient presents with  . Dizziness    with headaches and eye pressure onset 1 month with 3 episodes. before this started she has seen floaters    HPI Patient has had sudden dizziness with headaches and eye pressure The headaches are the same as they have been in the past, maybe a little more frequent Not wearing glasses as often; near-sighted; leveled off Dizziness; driving to work a few weeks ago, in the morning; got so dizzy that she had to pull over, then went away on its own within 30 seconds; felt pressure in her right side, like something moving around in her face; happened again on Monday of this week; felt like her eyes moved, and felt like movement and pressure; felt like left eye was going up and right eye was going down No nausea with either spells; no vomiting No fever, appetite good Started one month ago Has had three episodes Before onset, had been seeing floaters, white spots for about six months; she will have been sitting for a while and then sitting at her desk; they go away on their own; has not seen an eye doctor in a while; last visit was May 2018 No old injury; no morning nausea, no fam hx of brain stuff; no ringing in her ears; no rash Mother has Hashimoto's; eczema in the family too Patient thinks all this started after she had her baby  Does pretty rigorous [employer] work-outs, then feel sore; no other joint pains, but then says knees bother her a lot; right knee bothering her; modifies the work-out  Hypothyroidism; last TSH as normal 1.24; weight gain; stools the same; energy level ahs been good; lifting weights, so maybe that is part of the weight gain; on  [place]ra for Hashimotos  Vit D deficiency; occasional flintstone  Depression screen [facility] 2/9 04/02/2017 10/26/2016 09/28/2016 07/14/2016 12/24/2015  Decreased Interest 0 0 0 0 0  Down, Depressed, Hopeless 0 0 0 0 1  PHQ - 2 Score 0 0 0 0 1    Relevant past medical, surgical, family and social history reviewed Past Medical History:  Diagnosis Date  . Allergy   . Anemia   . Generalized anxiety disorder 10/26/2016  . Headache   . Hypothyroidism 08/14/2015  . Hypothyroidism due to Hashimoto's thyroiditis 09/07/2015  . Iron deficiency anemia 08/14/2015  . Uses central nervous system stimulants 03/12/2016  . Vitamin D deficiency 08/14/2015   Past Surgical History:  Procedure Laterality Date  . CESAREAN SECTION     Family History  Problem Relation Age of Onset  . Cancer Father        LUNG  . Thyroid disease Mother   . Cancer Paternal Grandmother        breast cancer  . Diabetes Sister   . Diabetes Brother   . Heart disease Brother        from his diabetes, heart attack  . Hypertension Brother   . Kidney disease Brother   . Cancer Maternal Aunt        UTERINE  . Eczema  Daughter   . Asthma Daughter   . Heart attack Maternal Grandfather   . Cancer Paternal Grandfather        lung   Social History   Tobacco Use  . Smoking status: Former Research scientist (life sciences)  . Smokeless tobacco: Never Used  Substance Use Topics  . Alcohol use: Yes    Alcohol/week: 0.0 oz    Comment: an ocassional glass of wine  . Drug use: No    Interim medical history since last visit reviewed. Allergies and medications reviewed  Review of Systems Per HPI unless specifically indicated above     Objective:    BP 122/78   Pulse 92   Temp 98.5 F (36.9 C) (Oral)   Resp 14   Wt 170 lb 11.2 oz (77.4 kg)   SpO2 96%   BMI 26.34 kg/m   Wt Readings from Last 3 Encounters:  04/02/17 170 lb 11.2 oz (77.4 kg)  10/26/16 163 lb (73.9 kg)  09/28/16 161 lb (73 kg)    Physical Exam  Constitutional: She appears well-developed  and well-nourished. No distress.  HENT:  Head: Normocephalic and atraumatic.  Right Ear: Tympanic membrane and ear canal normal. Tympanic membrane is not erythematous. No middle ear effusion.  Left Ear: Tympanic membrane and ear canal normal. Tympanic membrane is not erythematous.  No middle ear effusion.  Nose: Nasal deformity (turbinate hypertrophy LEFT) present.  Mouth/Throat: Oropharynx is clear and moist and mucous membranes are normal.  Eyes: EOM are normal. Pupils are equal, round, and reactive to light. Right eye exhibits no discharge. Left eye exhibits no discharge. Right conjunctiva is not injected. Left conjunctiva is not injected. No scleral icterus. Right eye exhibits normal extraocular motion and no nystagmus. Left eye exhibits normal extraocular motion and no nystagmus.  Neck: No thyromegaly present.  Cardiovascular: Normal rate, regular rhythm and normal heart sounds.  No murmur heard. Pulmonary/Chest: Effort normal and breath sounds normal. No respiratory distress. She has no wheezes.  Abdominal: Soft.  Musculoskeletal: Normal range of motion. She exhibits no edema.  Neurological: She is alert. She displays no tremor. No cranial nerve deficit. She exhibits normal muscle tone. She displays a negative Romberg sign. Coordination normal.  Reflex Scores:      Patellar reflexes are 2+ on the left side. No dysdiadochokinesis, normal finger-to-nose testing; no pronator drift; negative Romberg  Skin: Skin is warm and dry. No rash noted. She is not diaphoretic. No pallor.  Psychiatric: She has a normal mood and affect. Her behavior is normal. Judgment and thought content normal.    Results for orders placed or performed in visit on 10/26/16  TSH  Result Value Ref Range   TSH 1.24 mIU/L      Assessment & Plan:   Problem List Items Addressed This Visit      Endocrine   Hypothyroidism due to Hashimoto's thyroiditis    Check TSH given the weight gain      Relevant Orders    TSH     Other   Vitamin D deficiency    Vit D deficiency, check level      Relevant Orders   VITAMIN D 25 Hydroxy (Vit-D Deficiency, Fractures)   Chronically dry eyes    Continue eye drops      Relevant Orders   Ambulatory referral to Ophthalmology   Ambulatory referral to Neurology    Other Visit Diagnoses    Visual changes    -  Primary   Relevant Orders   Ambulatory referral  to Ophthalmology   Ambulatory referral to Neurology   Dizziness       Relevant Orders   Ambulatory referral to Ophthalmology   Ambulatory referral to Neurology   Facial pressure       Relevant Orders   Ambulatory referral to Ophthalmology   Ambulatory referral to Neurology   Hypertrophy of inferior nasal turbinate       Positive anti-CCP test       Relevant Orders   ANA,IFA RA Diag Pnl w/rflx Tit/Patn   Cyclic citrul peptide antibody, IgG   C-reactive protein      Follow up plan: No Follow-up on file.  An after-visit summary was printed and given to the patient at [facility].  Please see the patient instructions which may contain other information and recommendations beyond what is mentioned above in the assessment and plan.  Meds ordered this encounter  Medications  . fluticasone (FLONASE) 50 MCG/ACT nasal spray    Sig: Place 2 sprays into both nostrils daily.    Dispense:  16 g    Refill:  6    Orders Placed This Encounter  Procedures  . ANA,IFA RA Diag Pnl w/rflx Tit/Patn  . TSH  . Cyclic citrul peptide antibody, IgG  . C-reactive protein  . VITAMIN D 25 Hydroxy (Vit-D Deficiency, Fractures)  . Ambulatory referral to Ophthalmology  . Ambulatory referral to Neurology

## 2017-04-02 NOTE — Assessment & Plan Note (Signed)
Vit D deficiency, check level

## 2017-04-02 NOTE — Patient Instructions (Signed)
We'll have you see the eye specialist and the neurologist If symptoms become severe, please go to the ER We'll get labs today If you have not heard anything from my staff in a week about any orders/referrals/studies from today, please contact us here to follow-up [phone]

## 2017-04-02 NOTE — Assessment & Plan Note (Signed)
Check TSH given the weight gain

## 2017-04-02 NOTE — Assessment & Plan Note (Signed)
Continue eyedrops 

## 2017-04-05 LAB — VITAMIN D 25 HYDROXY (VIT D DEFICIENCY, FRACTURES): Vit D, 25-Hydroxy: 27 ng/mL — ABNORMAL LOW (ref 30–100)

## 2017-04-05 LAB — TSH: TSH: 2.11 mIU/L

## 2017-04-05 LAB — ANA,IFA RA DIAG PNL W/RFLX TIT/PATN
Anti Nuclear Antibody(ANA): NEGATIVE
Cyclic Citrullin Peptide Ab: 67 UNITS — ABNORMAL HIGH
Rhuematoid fact SerPl-aCnc: <14 IU/mL (ref <14)

## 2017-04-05 LAB — C-REACTIVE PROTEIN: CRP: 9.8 mg/L — ABNORMAL HIGH (ref <8.0)

## 2017-04-06 ENCOUNTER — Other Ambulatory Visit: Payer: Self-pay | Admitting: Family Medicine

## 2017-04-06 DIAGNOSIS — R768 Other specified abnormal immunological findings in serum: Secondary | ICD-10-CM | POA: Insufficient documentation

## 2017-04-06 DIAGNOSIS — R7989 Other specified abnormal findings of blood chemistry: Secondary | ICD-10-CM | POA: Insufficient documentation

## 2017-04-06 NOTE — Progress Notes (Signed)
Refer to rheum

## 2017-04-06 NOTE — Assessment & Plan Note (Signed)
Refer to rheum 

## 2017-04-13 ENCOUNTER — Other Ambulatory Visit: Payer: Self-pay | Admitting: Family Medicine

## 2017-04-13 MED ORDER — MOMETASONE FUROATE 50 MCG/ACT NA SUSP
2.0000 | Freq: Every day | NASAL | 12 refills | Status: DC
Start: 2017-04-13 — End: 2017-05-27

## 2017-04-13 NOTE — Progress Notes (Signed)
Fluticasone not covered; nasonex covered

## 2017-04-15 ENCOUNTER — Telehealth: Payer: Self-pay

## 2017-04-15 ENCOUNTER — Ambulatory Visit: Payer: 59 | Admitting: Neurology

## 2017-04-15 ENCOUNTER — Encounter: Payer: Self-pay | Admitting: Neurology

## 2017-04-15 VITALS — BP 122/84 | HR 80 | Ht 68.0 in | Wt 170.0 lb

## 2017-04-15 DIAGNOSIS — R42 Dizziness and giddiness: Secondary | ICD-10-CM | POA: Diagnosis not present

## 2017-04-15 DIAGNOSIS — G43109 Migraine with aura, not intractable, without status migrainosus: Secondary | ICD-10-CM

## 2017-04-15 DIAGNOSIS — H539 Unspecified visual disturbance: Secondary | ICD-10-CM | POA: Diagnosis not present

## 2017-04-15 MED ORDER — SUMATRIPTAN SUCCINATE 25 MG PO TABS: ORAL_TABLET | ORAL | 5 refills | Status: DC

## 2017-04-15 NOTE — Patient Instructions (Addendum)
1. Schedule MRI brain with and without contrast  We have scheduled you at [facility] for your OPEN MRI.  They will contact you to schedule.  Please arrive 30 minutes prior and go to [place]. If you need to reschedule for any reason please call [phone].  2. Try Imitrex 49m 1 tablet at onset of migraine/visual changes. Do not take more than 2-3 a week 3. Keep a calendar of your symptoms, we will review on next visit 4. Follow-up in 3 months, call for any changes

## 2017-04-15 NOTE — Progress Notes (Signed)
NEUROLOGY CONSULTATION NOTE  Melissa Harrison MRN: [id number] DOB: 1980/09/02  Referring provider: Dr. Enid Derry Primary care provider: Dr. Enid Derry  Reason for consult:  Dizziness, visual changes, facial pressure  Dear Dr Sanda Klein:  Thank you for your kind referral of Melissa Harrison for consultation of the above symptoms. Although her history is well known to you, please allow me to reiterate it for the purpose of our medical record. Records and images were personally reviewed where available.   HISTORY OF PRESENT ILLNESS: This is a pleasant 37 year old left-handed woman with a history of Hashimoto's thyroiditis, possible rheumatoid arthritis, presenting for evaluation of dizziness, vision changes, and facial pressure. Since September 2018, she has had brief episodes where she would see "squigglies" in both eyes (tried covering each eye) lasting a few seconds. Around 50% of the time, she would have a "regular headache" that resolves within an hour of taking Ibuprofen. These episodes occur multiple times a week, but have recently increased. Just now in the office she saw a little white squiggly line in the corner of her vision. More concerning for her were 2 intense dizzy episodes, one occurred 2 months ago, then she had another one 3-4 weeks ago. Both lasted less than 10 seconds with a spinning sensation where she had to sit down, followed by a moving pressure-like sensation over the right side of her face for a few seconds. These were not associated with the "squiggly lines." There was no associated nausea/vomiting, photo/phonophobia, focal numbness/tingling/weakness, confusion. She also reports migraines for the past 8 years where she would see zigzag lines, which may or may not progress to a headache lasting 20-30 minutes. She would be unable to function/drive and have to wear sunglasses. She notices acute bright lights or change in lighting can provoke them. Initially she was  having 1-2 a year, but these now occur every other week. She reports an odd episode last weekend after waking up from a nap, she felt pressure in both ears like she would have a cold, then voices of her daughter and husband sounded different, like they had a squeak at the end. This eventually subsided. She denies any loss of vision, no diplopia, dysarthria/dysphagia, neck pain, bowel/bladder dysfunction. She has low back pain. Her sister has migraines.   PAST MEDICAL HISTORY: Past Medical History:  Diagnosis Date  . Allergy   . Anemia   . Generalized anxiety disorder 10/26/2016  . Headache   . Hypothyroidism 08/14/2015  . Hypothyroidism due to Hashimoto's thyroiditis 09/07/2015  . Iron deficiency anemia 08/14/2015  . Uses central nervous system stimulants 03/12/2016  . Vitamin D deficiency 08/14/2015    PAST SURGICAL HISTORY: Past Surgical History:  Procedure Laterality Date  . CESAREAN SECTION      MEDICATIONS: Current Outpatient Medications on File Prior to Visit  Medication Sig Dispense Refill  . desogestrel-ethinyl estradiol (MIRCETTE) 0.15-0.02/0.01 MG (21/5) tablet Take 1 tablet by mouth daily. 3 Package 6  . escitalopram (LEXAPRO) 10 MG tablet TAKE 1 TABLET(10 MG) BY MOUTH DAILY 30 tablet 11  . levothyroxine (SYNTHROID, LEVOTHROID) 112 MCG tablet Take 1 tablet (112 mcg total) by mouth every other day. 45 tablet 1  . levothyroxine (SYNTHROID, LEVOTHROID) 125 MCG tablet TAKE 1 TABLET BY MOUTH EVERY OTHER DAY(ON ODD-NUMBERED DAYS) 45 tablet 1  . Lifitegrast (XIIDRA) 5 % SOLN Apply to eye.    Marland Kitchen LORazepam (ATIVAN) 0.5 MG tablet Take 0.5-1 tablets (0.25-0.5 mg total) by mouth 2 (two) times daily  as needed for anxiety. No alcohol 5 tablet 0  . mometasone (NASONEX) 50 MCG/ACT nasal spray Place 2 sprays into the nose daily. (2 sprays in each nostril once a day) 17 g 12  . scopolamine (TRANSDERM-SCOP, 1.5 MG,) 1 MG/3DAYS Place 1 patch (1.5 mg total) onto the skin every 3 (three) days. 8 patch 0  .  Wheat Dextrin (BENEFIBER) POWD as needed. Per package directions  0   No current facility-administered medications on file prior to visit.     ALLERGIES: Allergies  Allergen Reactions  . Penicillins     FAMILY HISTORY: Family History  Problem Relation Age of Onset  . Cancer Father        LUNG  . Thyroid disease Mother   . Cancer Paternal Grandmother        breast cancer  . Diabetes Sister   . Diabetes Brother   . Heart disease Brother        from his diabetes, heart attack  . Hypertension Brother   . Kidney disease Brother   . Cancer Maternal Aunt        UTERINE  . Eczema Daughter   . Asthma Daughter   . Heart attack Maternal Grandfather   . Cancer Paternal Grandfather        lung    SOCIAL HISTORY: Social History   Socioeconomic History  . Marital status: Married    Spouse name: Not on file  . Number of children: 1  . Years of education: 58  . Highest education level: Not on file  Social Needs  . Financial resource strain: Not on file  . Food insecurity - worry: Not on file  . Food insecurity - inability: Not on file  . Transportation needs - medical: Not on file  . Transportation needs - non-medical: Not on file  Occupational History  . Occupation: Secondary school teacher    Comment: [place] Use  . Smoking status: Former Research scientist (life sciences)  . Smokeless tobacco: Never Used  Substance and Sexual Activity  . Alcohol use: Yes    Alcohol/week: 0.0 oz    Comment: an ocassional glass of wine  . Drug use: No  . Sexual activity: Yes    Partners: Male    Birth control/protection: None  Other Topics Concern  . Not on file  Social History Narrative  . Not on file    REVIEW OF SYSTEMS: Constitutional: No fevers, chills, or sweats, no generalized fatigue, change in appetite Eyes: No visual changes, double vision, eye pain Ear, nose and throat: No hearing loss, ear pain, nasal congestion, sore throat Cardiovascular: No chest pain, palpitations Respiratory:  No  shortness of breath at rest or with exertion, wheezes GastrointestinaI: No nausea, vomiting, diarrhea, abdominal pain, fecal incontinence Genitourinary:  No dysuria, urinary retention or frequency Musculoskeletal:  No neck pain, +back pain Integumentary: No rash, pruritus, skin lesions Neurological: as above Psychiatric: No depression, insomnia, anxiety Endocrine: No palpitations, fatigue, diaphoresis, mood swings, change in appetite, change in weight, increased thirst Hematologic/Lymphatic:  No anemia, purpura, petechiae. Allergic/Immunologic: no itchy/runny eyes, nasal congestion, recent allergic reactions, rashes  PHYSICAL EXAM: Vitals:   04/15/17 0913  BP: 122/84  Pulse: 80  SpO2: 97%   General: No acute distress Head:  Normocephalic/atraumatic Eyes: Fundoscopic exam shows bilateral sharp discs, no vessel changes, exudates, or hemorrhages Neck: supple, no paraspinal tenderness, full range of motion Back: No paraspinal tenderness Heart: regular rate and rhythm Lungs: Clear to auscultation bilaterally. Vascular: No carotid bruits. Skin/Extremities:  No rash, no edema Neurological Exam: Mental status: alert and oriented to person, place, and time, no dysarthria or aphasia, Fund of knowledge is appropriate.  Recent and remote memory are intact.  Attention and concentration are normal.    Able to name objects and repeat phrases. Cranial nerves: CN I: not tested CN II: pupils equal, round and reactive to light, visual fields intact, fundi unremarkable. CN III, IV, VI:  full range of motion, no nystagmus, no ptosis CN V: facial sensation intact CN VII: upper and lower face symmetric CN VIII: hearing intact to finger rub CN IX, X: gag intact, uvula midline CN XI: sternocleidomastoid and trapezius muscles intact CN XII: tongue midline Bulk & Tone: normal, no fasciculations. Motor: 5/5 throughout with no pronator drift. Sensation: intact to light touch, cold, pin, vibration and  joint position sense.  No extinction to double simultaneous stimulation.  Romberg test negative Deep Tendon Reflexes: +1 on both UE, +2 on both LE, no ankle clonus Plantar responses: downgoing bilaterally Cerebellar: no incoordination on finger to nose, heel to shin. No dysdiadochokinesia Gait: narrow-based and steady, able to tandem walk adequately. Tremor: none  IMPRESSION: This is a pleasant 37 year old left-handed woman with a history of migraines with aura, presenting with a change in symptoms over the past 5 months. She has started seeing squiggly lines, different from her typical migraine visual aura, as well as new onset transient dizzy spells with associated right facial pressure. Etiology of symptoms is unclear, they may still relate to migraine phenomena, and with the dizzy spells representing vestibular migraines, although they are quite brief in duration. Her neurological exam is normal. MRI brain with and without contrast will be ordered to assess for underlying structural abnormality. She will try Imitrex for migraine rescue, side effects were discussed. She will keep a calendar of her symptoms, if episodes increase in frequency, we will consider increasing Lexapro, this has been found to help with migraine prophylaxis. She had one episode of hearing changes last weekend, if the episodes of hearing changes increase, as well as dizziness, would also consider ENT evaluation. She will follow-up in 3 months and knows to call for any changes.   Thank you for allowing me to participate in the care of this patient. Please do not hesitate to call for any questions or concerns.   Ellouise Newer, M.D.  CC: Dr. Sanda Klein

## 2017-04-20 ENCOUNTER — Encounter: Payer: Self-pay | Admitting: Obstetrics and Gynecology

## 2017-04-20 ENCOUNTER — Telehealth: Payer: Self-pay | Admitting: Neurology

## 2017-04-20 NOTE — Telephone Encounter (Signed)
Patient called to let you know that her MRI was scheduled for 04/29/17 at 2:00 PM. She also needs a Valium called into [facility] on [address] in [place]. Please Call. Thanks

## 2017-04-21 ENCOUNTER — Other Ambulatory Visit: Payer: Self-pay | Admitting: *Deleted

## 2017-04-21 MED ORDER — DESOGESTREL-ETHINYL ESTRADIOL 0.15-0.02/0.01 MG (21/5) PO TABS: 1.0000 | ORAL_TABLET | Freq: Every day | ORAL | 6 refills | Status: DC

## 2017-04-22 NOTE — Telephone Encounter (Signed)
Rx sent to pt's listed preferred pharmacy.  Valium 5mg  #2 no refills for MRI

## 2017-04-25 NOTE — Telephone Encounter (Signed)
Refill request for patient back in November 2018, open note Patient has had this refilled since then; needs have been met Signing off on old note I refused the pending refill to pharmacy that was still open

## 2017-04-27 DIAGNOSIS — R7982 Elevated C-reactive protein (CRP): Secondary | ICD-10-CM | POA: Insufficient documentation

## 2017-04-27 DIAGNOSIS — M25669 Stiffness of unspecified knee, not elsewhere classified: Secondary | ICD-10-CM | POA: Insufficient documentation

## 2017-05-03 ENCOUNTER — Encounter: Payer: Self-pay | Admitting: Obstetrics and Gynecology

## 2017-05-04 ENCOUNTER — Other Ambulatory Visit: Payer: Self-pay | Admitting: *Deleted

## 2017-05-04 ENCOUNTER — Telehealth: Payer: Self-pay | Admitting: Neurology

## 2017-05-04 MED ORDER — DESOGESTREL-ETHINYL ESTRADIOL 0.15-0.02/0.01 MG (21/5) PO TABS: 1.0000 | ORAL_TABLET | Freq: Every day | ORAL | 6 refills | Status: DC

## 2017-05-04 NOTE — Telephone Encounter (Signed)
Patient would like the results of the MRI

## 2017-05-04 NOTE — Telephone Encounter (Signed)
error 

## 2017-05-05 ENCOUNTER — Telehealth: Payer: Self-pay

## 2017-05-05 NOTE — Telephone Encounter (Signed)
Spoke with pt relaying message below.   

## 2017-05-05 NOTE — Telephone Encounter (Signed)
Relayed normal MRI results to pt.

## 2017-05-05 NOTE — Telephone Encounter (Signed)
-----   Message from Van ClinesKaren M Aquino, MD sent at 05/05/2017  8:38 AM EST ----- Regarding: MRI results Pls let her know the MRI brain is normal, no evidence of tumor, stroke, or bleed. Thanks

## 2017-05-13 ENCOUNTER — Encounter: Payer: Self-pay | Admitting: Neurology

## 2017-05-21 ENCOUNTER — Other Ambulatory Visit: Payer: Self-pay | Admitting: Neurology

## 2017-05-21 MED ORDER — ESCITALOPRAM OXALATE 10 MG PO TABS: ORAL_TABLET | ORAL | 11 refills | Status: DC

## 2017-05-26 ENCOUNTER — Encounter: Payer: Self-pay | Admitting: Family Medicine

## 2017-05-27 ENCOUNTER — Ambulatory Visit (INDEPENDENT_AMBULATORY_CARE_PROVIDER_SITE_OTHER): Payer: 59 | Admitting: Obstetrics and Gynecology

## 2017-05-27 ENCOUNTER — Encounter: Payer: Self-pay | Admitting: Obstetrics and Gynecology

## 2017-05-27 VITALS — BP 136/92 | HR 91 | Ht 68.0 in | Wt 171.6 lb

## 2017-05-27 DIAGNOSIS — Z01419 Encounter for gynecological examination (general) (routine) without abnormal findings: Secondary | ICD-10-CM

## 2017-05-27 NOTE — Progress Notes (Signed)
Subjective:   Melissa Harrison is a 37 y.o. G56P0 Caucasian female here for a routine well-woman exam.  No LMP recorded. (Menstrual status: Oral contraceptives).    Current complaints: none PCP: [facility]       doesn't desire labs  Social History: Sexual: heterosexual Marital Status: married Living situation: with family Occupation: Ship broker Tobacco/alcohol: no tobacco use Illicit drugs: no history of illicit drug use  The following portions of the patient's history were reviewed and updated as appropriate: allergies, current medications, past family history, past medical history, past social history, past surgical history and problem list.  Past Medical History Past Medical History:  Diagnosis Date  . Allergy   . Anemia   . Generalized anxiety disorder 10/26/2016  . Headache   . Hypothyroidism 08/14/2015  . Hypothyroidism due to Hashimoto's thyroiditis 09/07/2015  . Iron deficiency anemia 08/14/2015  . Uses central nervous system stimulants 03/12/2016  . Vitamin D deficiency 08/14/2015    Past Surgical History Past Surgical History:  Procedure Laterality Date  . CESAREAN SECTION      Gynecologic History G1P0  No LMP recorded. (Menstrual status: Oral contraceptives). Contraception: OCP (estrogen/progesterone) Last Pap: 2017. Results were: normal   Obstetric History OB History  Gravida Para Term Preterm AB Living  1         1  SAB TAB Ectopic Multiple Live Births          1    # Outcome Date GA Lbr Len/2nd Weight Sex Delivery Anes PTL Lv  1 Gravida 11/11/12     CS-Unspec   LIV    Current Medications Current Outpatient Medications on File Prior to Visit  Medication Sig Dispense Refill  . desogestrel-ethinyl estradiol (MIRCETTE) 0.15-0.02/0.01 MG (21/5) tablet Take 1 tablet by mouth daily. Pt takes one pill qd, however skips the placebos 3 Package 6  . escitalopram (LEXAPRO) 10 MG tablet Take 2 tablets daily (Patient taking differently: 20 mg. Take 2 tablets daily)  60 tablet 11  . levothyroxine (SYNTHROID, LEVOTHROID) 112 MCG tablet Take 1 tablet (112 mcg total) by mouth every other day. 45 tablet 1  . levothyroxine (SYNTHROID, LEVOTHROID) 125 MCG tablet TAKE 1 TABLET BY MOUTH EVERY OTHER DAY(ON ODD-NUMBERED DAYS) 45 tablet 1  . Lifitegrast (XIIDRA) 5 % SOLN Apply to eye.    Marland Kitchen LORazepam (ATIVAN) 0.5 MG tablet Take 0.5-1 tablets (0.25-0.5 mg total) by mouth 2 (two) times daily as needed for anxiety. No alcohol (Patient not taking: Reported on 05/27/2017) 5 tablet 0  . SUMAtriptan (IMITREX) 25 MG tablet Take 1 tablet at onset of migraine. Do not take more than 2-3 a week (Patient not taking: Reported on 05/27/2017) 10 tablet 5  . Wheat Dextrin (BENEFIBER) POWD as needed. Per package directions  0   No current facility-administered medications on file prior to visit.     Review of Systems Patient denies any headaches, blurred vision, shortness of breath, chest pain, abdominal pain, problems with bowel movements, urination, or intercourse.  Objective:  BP (!) 136/92   Pulse 91   Ht 5' 8"  (1.727 m)   Wt 171 lb 9.6 oz (77.8 kg)   BMI 26.09 kg/m  Physical Exam  General:  Well developed, well nourished, no acute distress. She is alert and oriented x3. Skin:  Warm and dry Neck:  Midline trachea, no thyromegaly or nodules Cardiovascular: Regular rate and rhythm, no murmur heard Lungs:  Effort normal, all lung fields clear to auscultation bilaterally Breasts:  No dominant palpable  mass, retraction, or nipple discharge Abdomen:  Soft, non tender, no hepatosplenomegaly or masses Pelvic:  External genitalia is normal in appearance.  The vagina is normal in appearance. The cervix is bulbous, no CMT.  Thin prep pap is not done . Uterus is felt to be normal size, shape, and contour.  No adnexal masses or tenderness noted. Extremities:  No swelling or varicosities noted Psych:  She has a normal mood and affect  Assessment:   Healthy well-woman exam  Plan:    F/U 1 year for AE, or sooner if needed   [employer] Rockney Ghee, CNM

## 2017-06-16 ENCOUNTER — Encounter: Payer: Self-pay | Admitting: Gastroenterology

## 2017-06-16 ENCOUNTER — Ambulatory Visit: Payer: 59 | Admitting: Gastroenterology

## 2017-06-16 VITALS — BP 120/81 | HR 93 | Temp 98.2°F | Resp 16 | Ht 68.0 in | Wt 167.2 lb

## 2017-06-16 DIAGNOSIS — R197 Diarrhea, unspecified: Secondary | ICD-10-CM

## 2017-06-16 DIAGNOSIS — K921 Melena: Secondary | ICD-10-CM

## 2017-06-16 NOTE — Progress Notes (Signed)
Wyline Mood MD, MRCP(U.K) [address]  [address]  [place], [place] [address]  Main: [phone]  Fax: [phone]   Gastroenterology Consultation  Referring Provider:     Kerman Passey, MD Primary Care Physician:  Kerman Passey, MD Primary Gastroenterologist:  Dr. Wyline Mood  Reason for Consultation:    Blood in stool        HPI:   Melissa Harrison is a 37 y.o. y/o female referred for consultation & management  by Dr. Sherie Don, Janit Bern, MD.    She was seen on 06/07/17 at [facility] for constipation and had noticed some blood in her stool. Hb 12.6 with MCV 89, X ray abdomen showed mild stool burden.    About 6 weeks back , started having some loose stools, felt she was constipated, felt she could not "go", notice blood in every bowel movement on the stool, bright red, painless, she went to the ER , didn't get any answers, X ray showed no gross abnormality. This weekend, when she would have a bowel movement , had lower abdominal cramping with a lot of nausea during the bowel movement , would throw up and then nauseous for a few more minutes then resolve. Occurred all day for several days. On Sunday had a bad cold and was given some penicillin, 12 hours later her abdomen started feeling significantly better.    Presently still has 2-3 bowel movements , porridge consistency , no pain with blood on top of stool. Nausea is during a bowel movement.   Takes Ibuprofen 2-3 times a day for , for the past week, No THC or illegal drug use . Occasional wine. No family history of colon cancer. Never had a colonoscopy. No other medical problems. Has lost some weight recently. No prior gain of weight . No heart burn.      Past Medical History:  Diagnosis Date  . Allergy   . Anemia   . Generalized anxiety disorder 10/26/2016  . Headache   . Hypothyroidism 08/14/2015  . Hypothyroidism due to Hashimoto's thyroiditis 09/07/2015  . Iron deficiency anemia 08/14/2015  . Uses central nervous system  stimulants 03/12/2016  . Vitamin D deficiency 08/14/2015    Past Surgical History:  Procedure Laterality Date  . CESAREAN SECTION      Prior to Admission medications   Medication Sig Start Date End Date Taking? Authorizing Provider  diazepam (VALIUM) 5 MG tablet as needed For MRI 04/22/17  Yes [provider]  amoxicillin-clavulanate (AUGMENTIN) 875-125 MG tablet TK 1 T PO BID 06/13/17   [provider]  desogestrel-ethinyl estradiol (MIRCETTE) 0.15-0.02/0.01 MG (21/5) tablet Take 1 tablet by mouth daily. Pt takes one pill qd, however skips the placebos 05/04/17   Shambley, Melody N, CNM  ergocalciferol (VITAMIN D2) 50000 units capsule Take by mouth.    [provider]  escitalopram (LEXAPRO) 10 MG tablet Take 2 tablets daily Patient taking differently: 20 mg. Take 2 tablets daily 05/21/17   Van Clines, MD  levothyroxine (SYNTHROID, LEVOTHROID) 112 MCG tablet Take 1 tablet (112 mcg total) by mouth every other day. 02/07/17   Lada, Janit Bern, MD  levothyroxine (SYNTHROID, LEVOTHROID) 125 MCG tablet TAKE 1 TABLET BY MOUTH EVERY OTHER DAY(ON ODD-NUMBERED DAYS) 02/07/17   Lada, Janit Bern, MD  Lifitegrast Benay Spice) 5 % SOLN Apply to eye.    [provider]  LORazepam (ATIVAN) 0.5 MG tablet Take 0.5-1 tablets (0.25-0.5 mg total) by mouth 2 (two) times daily as needed  for anxiety. No alcohol Patient not taking: Reported on 05/27/2017 02/03/17   Kerman Passey, MD  Menaquinone-7 (VITAMIN K2 PO) Take by mouth.    [provider]  predniSONE (DELTASONE) 20 MG tablet  06/13/17   [provider]  SUMAtriptan (IMITREX) 25 MG tablet Take 1 tablet at onset of migraine. Do not take more than 2-3 a week Patient not taking: Reported on 05/27/2017 04/15/17   Van Clines, MD  Wheat Dextrin [facility]) POWD as needed. Per package directions 09/28/16   Kerman Passey, MD    Family History  Problem Relation Age of Onset  . Cancer Father        LUNG  . Thyroid  disease Mother   . Cancer Paternal Grandmother        breast cancer  . Diabetes Sister   . Diabetes Brother   . Heart disease Brother        from his diabetes, heart attack  . Hypertension Brother   . Kidney disease Brother   . Cancer Maternal Aunt        UTERINE  . Eczema Daughter   . Asthma Daughter   . Heart attack Maternal Grandfather   . Cancer Paternal Grandfather        lung     Social History   Tobacco Use  . Smoking status: Former Games developer  . Smokeless tobacco: Never Used  Substance Use Topics  . Alcohol use: Yes    Alcohol/week: 0.0 oz    Comment: an ocassional glass of wine  . Drug use: No    Allergies as of 06/16/2017 - Review Complete 05/27/2017  Allergen Reaction Noted  . Penicillins  11/26/2014    Review of Systems:    All systems reviewed and negative except where noted in HPI.   Physical Exam:  There were no vitals taken for this visit. No LMP recorded. (Menstrual status: Oral contraceptives). Psych:  Alert and cooperative. Normal mood and affect. General:   Alert,  Well-developed, well-nourished, pleasant and cooperative in NAD Head:  Normocephalic and atraumatic. Eyes:  Sclera clear, no icterus.   Conjunctiva pink. Ears:  Normal auditory acuity. Nose:  No deformity, discharge, or lesions. Mouth:  No deformity or lesions,oropharynx pink & moist. Neck:  Supple; no masses or thyromegaly. Lungs:  Respirations even and unlabored.  Clear throughout to auscultation.   No wheezes, crackles, or rhonchi. No acute distress. Heart:  Regular rate and rhythm; no murmurs, clicks, rubs, or gallops. Abdomen:  Normal bowel sounds.  No bruits.  Soft, non-tender and non-distended without masses, hepatosplenomegaly or hernias noted.  No guarding or rebound tenderness.    Msk:  Symmetrical without gross deformities. Good, equal movement & strength bilaterally. Pulses:  Normal pulses noted. Extremities:  No clubbing or edema.  No cyanosis. Neurologic:  Alert and  oriented x3;  grossly normal neurologically. Skin:  Intact without significant lesions or rashes. No jaundice. Lymph Nodes:  No significant cervical adenopathy. Psych:  Alert and cooperative. Normal mood and affect.  Imaging Studies: No results found.  Assessment and Plan:   Melissa Harrison is a 37 y.o. y/o female has been referred for blood in stool and diarrhea past few weeks    Plan  1. Colonoscopy if stool tests are negative for infection  2. Stop NSAID use   I have discussed alternative options, risks & benefits,  which include, but are not limited to, bleeding, infection, perforation,respiratory complication & drug reaction.  The patient agrees with  this plan & written consent will be obtained.     Follow up in 8 weeks   Dr Wyline MoodKiran Kacey Vicuna MD,MRCP(U.K)

## 2017-06-18 ENCOUNTER — Telehealth: Payer: Self-pay | Admitting: Gastroenterology

## 2017-06-18 NOTE — Telephone Encounter (Signed)
Patient calling about setting up a colonoscopy.

## 2017-06-21 ENCOUNTER — Telehealth: Payer: Self-pay

## 2017-06-21 DIAGNOSIS — K5900 Constipation, unspecified: Secondary | ICD-10-CM

## 2017-06-21 NOTE — Telephone Encounter (Signed)
Pt called wants to see about getting a note for real state exam to get extra time if she gets a migraine?    Pt wants new referral to a different GI other than [facility] she did not like them?

## 2017-06-21 NOTE — Telephone Encounter (Signed)
We actually don't have migraine listed as a medical problem for her She is on combination oral contraceptives Please make sure that she has told Melody Shambley about her migraines; I don't see that in her problem list or history other than "headache" and that is important If she has migraine with aura, combination birth control pills are contraindicated (she should NOT take them) Please ask her to schedule a visit to discuss Please make referral as requested to GI

## 2017-06-22 ENCOUNTER — Telehealth: Payer: Self-pay | Admitting: Gastroenterology

## 2017-06-22 ENCOUNTER — Ambulatory Visit: Payer: 59 | Admitting: Gastroenterology

## 2017-06-22 NOTE — Telephone Encounter (Signed)
Left detailed voicemail, referral ordered

## 2017-06-22 NOTE — Telephone Encounter (Signed)
Pt is calling is calling to speak to nurse about questions she has in regards to her coplonoscopy she states [facility] will cost her $1200 when other places would charge less she has  Questions about this, also about the stool sample  cb [phone]

## 2017-06-23 NOTE — Telephone Encounter (Signed)
Returned patient's call.   LVM for callback with names of locations for her procedure. [facility] we'll be happy to make an arrangement.

## 2017-06-24 ENCOUNTER — Telehealth: Payer: Self-pay

## 2017-06-24 ENCOUNTER — Telehealth: Payer: Self-pay | Admitting: Gastroenterology

## 2017-06-24 NOTE — Telephone Encounter (Signed)
Spoke to [place] at Dr. Delight Ovens office. Per Panya, patient needs to go to [facility] or [facility]. Delsa Sale will retract the referral and send one to the correct office.

## 2017-06-24 NOTE — Telephone Encounter (Signed)
Copied from CRM [phone]. Topic: Referral - Question >> Jun 24, 2017  3:27 PM Oneal GroutSebastian, Jennifer S wrote: Reason for CRM: [facility] does not accept patient insurance, [facility]  [facility].

## 2017-07-14 ENCOUNTER — Encounter: Payer: Self-pay | Admitting: Obstetrics and Gynecology

## 2017-07-14 ENCOUNTER — Other Ambulatory Visit: Payer: Self-pay | Admitting: *Deleted

## 2017-07-14 MED ORDER — FLUCONAZOLE 150 MG PO TABS: 150.0000 mg | ORAL_TABLET | Freq: Once | ORAL | 2 refills | Status: AC

## 2017-07-19 ENCOUNTER — Ambulatory Visit: Payer: 59 | Admitting: Neurology

## 2017-08-16 ENCOUNTER — Ambulatory Visit: Payer: 59 | Admitting: Gastroenterology

## 2017-08-22 ENCOUNTER — Other Ambulatory Visit: Payer: Self-pay | Admitting: Family Medicine

## 2017-08-23 MED ORDER — LEVOTHYROXINE SODIUM 112 MCG PO TABS: 112.0000 ug | ORAL_TABLET | ORAL | 1 refills | Status: DC

## 2017-08-23 NOTE — Telephone Encounter (Signed)
Lab Results  Component Value Date   TSH 2.11 04/02/2017   Alternating 112 and 125; both Rx approved (only 125 requested)

## 2017-08-25 ENCOUNTER — Encounter: Payer: Self-pay | Admitting: Family Medicine

## 2017-08-26 ENCOUNTER — Telehealth: Payer: Self-pay

## 2017-08-26 NOTE — Telephone Encounter (Signed)
Pt.notified

## 2017-08-26 NOTE — Telephone Encounter (Signed)
Copied from CRM [id number]. Topic: Inquiry >> Aug 25, 2017  3:10 PM Yvonna Alanisobinson, Andra M wrote: Reason for CRM: Patient would like to come in to have her TSH lab. Patient wants Dr. Sherie DonLada [facility] place [facility]. Patient would like a call back so that she will know when she can come in for this lab appt.       Thank You!!!

## 2017-08-26 NOTE — Telephone Encounter (Signed)
So if her labs are stable and her symptoms are stable, we only need to check her TSH yearly If she's having symptoms (weight change, energy change, etc.), then please enter TSH and she can come any time

## 2017-10-13 ENCOUNTER — Other Ambulatory Visit: Payer: Self-pay | Admitting: Gastroenterology

## 2017-10-13 DIAGNOSIS — R110 Nausea: Secondary | ICD-10-CM

## 2017-10-13 DIAGNOSIS — R112 Nausea with vomiting, unspecified: Secondary | ICD-10-CM

## 2017-10-13 DIAGNOSIS — K625 Hemorrhage of anus and rectum: Secondary | ICD-10-CM

## 2017-10-13 DIAGNOSIS — R1013 Epigastric pain: Secondary | ICD-10-CM

## 2017-10-19 ENCOUNTER — Other Ambulatory Visit
Admission: RE | Admit: 2017-10-19 | Discharge: 2017-10-19 | Disposition: A | Discharge status: Home / Self Care | Payer: 59 | Source: Ambulatory Visit | Attending: Gastroenterology | Admitting: Gastroenterology

## 2017-10-19 DIAGNOSIS — R197 Diarrhea, unspecified: Secondary | ICD-10-CM | POA: Diagnosis present

## 2017-10-19 LAB — GASTROINTESTINAL PANEL BY PCR, STOOL (REPLACES STOOL CULTURE)

## 2017-10-19 LAB — C DIFFICILE QUICK SCREEN W PCR REFLEX
C Diff antigen: NEGATIVE
C Diff interpretation: NOT DETECTED
C Diff toxin: NEGATIVE

## 2017-10-28 ENCOUNTER — Ambulatory Visit
Admission: RE | Admit: 2017-10-28 | Discharge: 2017-10-28 | Disposition: A | Discharge status: Home / Self Care | Payer: 59 | Source: Ambulatory Visit | Attending: Gastroenterology | Admitting: Gastroenterology

## 2017-10-28 ENCOUNTER — Encounter
Admission: RE | Admit: 2017-10-28 | Discharge: 2017-10-28 | Disposition: A | Discharge status: Home / Self Care | Payer: 59 | Source: Ambulatory Visit | Attending: Gastroenterology | Admitting: Gastroenterology

## 2017-10-28 DIAGNOSIS — K824 Cholesterolosis of gallbladder: Secondary | ICD-10-CM | POA: Diagnosis not present

## 2017-10-28 DIAGNOSIS — R1013 Epigastric pain: Secondary | ICD-10-CM

## 2017-10-28 DIAGNOSIS — K625 Hemorrhage of anus and rectum: Secondary | ICD-10-CM

## 2017-10-28 DIAGNOSIS — R110 Nausea: Secondary | ICD-10-CM

## 2017-10-28 DIAGNOSIS — R112 Nausea with vomiting, unspecified: Secondary | ICD-10-CM

## 2017-10-28 MED ORDER — TECHNETIUM TC 99M MEBROFENIN IV KIT
5.5000 | PACK | Freq: Once | INTRAVENOUS | Status: AC | PRN
  Administered 2017-10-28: 5.5 via INTRAVENOUS

## 2017-11-18 ENCOUNTER — Other Ambulatory Visit: Payer: Self-pay

## 2017-11-18 MED ORDER — LEVOTHYROXINE SODIUM 112 MCG PO TABS: 112.0000 ug | ORAL_TABLET | ORAL | 1 refills | Status: DC

## 2017-11-18 MED ORDER — LEVOTHYROXINE SODIUM 125 MCG PO TABS: 125.0000 ug | ORAL_TABLET | ORAL | 0 refills | Status: DC

## 2017-11-19 ENCOUNTER — Other Ambulatory Visit: Payer: Self-pay

## 2017-11-19 MED ORDER — FLUCONAZOLE 150 MG PO TABS: 150.0000 mg | ORAL_TABLET | ORAL | 0 refills | Status: DC

## 2017-12-20 ENCOUNTER — Other Ambulatory Visit: Payer: Self-pay | Admitting: Surgery

## 2017-12-20 DIAGNOSIS — R10811 Right upper quadrant abdominal tenderness: Secondary | ICD-10-CM

## 2018-01-02 ENCOUNTER — Encounter: Payer: Self-pay | Admitting: Family Medicine

## 2018-01-02 DIAGNOSIS — E038 Other specified hypothyroidism: Secondary | ICD-10-CM

## 2018-01-02 DIAGNOSIS — E063 Autoimmune thyroiditis: Principal | ICD-10-CM

## 2018-01-10 ENCOUNTER — Other Ambulatory Visit: Payer: Self-pay

## 2018-01-10 DIAGNOSIS — E063 Autoimmune thyroiditis: Principal | ICD-10-CM

## 2018-01-10 DIAGNOSIS — E038 Other specified hypothyroidism: Secondary | ICD-10-CM

## 2018-01-11 LAB — T4, FREE: Free T4: 1.2 ng/dL (ref 0.8–1.8)

## 2018-01-11 LAB — TSH: TSH: 2.93 mIU/L

## 2018-01-25 ENCOUNTER — Encounter: Payer: Self-pay | Admitting: Family Medicine

## 2018-01-27 MED ORDER — SCOPOLAMINE 1 MG/3DAYS TD PT72: 1.0000 | MEDICATED_PATCH | TRANSDERMAL | 1 refills | Status: DC

## 2018-01-27 MED ORDER — LORAZEPAM 0.5 MG PO TABS: 0.2500 mg | ORAL_TABLET | Freq: Two times a day (BID) | ORAL | 0 refills | Status: DC | PRN

## 2018-01-27 NOTE — Telephone Encounter (Signed)
PMP Aware reviewed Limited Rx for travel anxiety medicine approved

## 2018-02-09 ENCOUNTER — Telehealth: Payer: Self-pay | Admitting: Obstetrics and Gynecology

## 2018-02-09 NOTE — Telephone Encounter (Signed)
Gave pt pack of OCP

## 2018-02-09 NOTE — Telephone Encounter (Signed)
The patient states she is going out of the country tomorrow and is going to be two pills short on her BCPs and is asking for either some samples or a script today if possible, please advise, thanks.

## 2018-02-12 ENCOUNTER — Other Ambulatory Visit: Payer: Self-pay | Admitting: Family Medicine

## 2018-02-12 NOTE — Telephone Encounter (Signed)
Lab Results  Component Value Date   TSH 2.93 01/10/2018   T4TOTAL 7.3 11/25/2015    

## 2018-04-25 ENCOUNTER — Ambulatory Visit: Payer: 59 | Admitting: Family Medicine

## 2018-04-27 ENCOUNTER — Ambulatory Visit: Payer: 59 | Admitting: Family Medicine

## 2018-04-27 ENCOUNTER — Encounter: Payer: Self-pay | Admitting: Family Medicine

## 2018-04-27 VITALS — BP 120/72 | HR 78 | Temp 97.6°F | Resp 12 | Ht 68.0 in | Wt 177.9 lb

## 2018-04-27 DIAGNOSIS — E038 Other specified hypothyroidism: Secondary | ICD-10-CM | POA: Diagnosis not present

## 2018-04-27 DIAGNOSIS — R197 Diarrhea, unspecified: Secondary | ICD-10-CM

## 2018-04-27 DIAGNOSIS — R140 Abdominal distension (gaseous): Secondary | ICD-10-CM

## 2018-04-27 DIAGNOSIS — D509 Iron deficiency anemia, unspecified: Secondary | ICD-10-CM

## 2018-04-27 DIAGNOSIS — E063 Autoimmune thyroiditis: Secondary | ICD-10-CM

## 2018-04-27 MED ORDER — HYOSCYAMINE SULFATE 0.125 MG PO TABS: 0.1250 mg | ORAL_TABLET | Freq: Four times a day (QID) | ORAL | 0 refills | Status: DC | PRN

## 2018-04-27 NOTE — Assessment & Plan Note (Signed)
Normal thyroid labs not long ago, so don't think GI symptoms are thyroid-related

## 2018-04-27 NOTE — Progress Notes (Signed)
BP 120/72   Pulse 78   Temp 97.6 F (36.4 C) (Oral)   Resp 12   Ht 5' 8"  (1.727 m)   Wt 177 lb 14.4 oz (80.7 kg)   SpO2 98%   BMI 27.05 kg/m    Subjective:    Patient ID: Melissa Harrison, female    DOB: May 14, 1980, 38 y.o.   MRN: [id number]  HPI: Melissa Harrison is a 38 y.o. female  Chief Complaint  Patient presents with  . Abdominal Pain    with diarrhea thinks IBS wants to see about getting med    HPI Patient is here for abdominal pain She had issues for well over a year, maybe a year and a half She has never tried IBS medicine Has had two xrays and a HIDA scan She was seen by gastroenterologist They told her they don't think she needed a colonoscopy; rectal bleeding was just from hemorrhoid internal, from gestation, taking extra iron and getting constipated She will be fine for months She did allergy testing for foods, not allergic to any moods Not especially tied to stress that she can relate, not really paid attention to that Not especially stressful; lexapro is working Initial visit to GI was June 2019 (Dr. Vira Agar); she actually saw another GI doctor in April 2019 (Dr. Vicente Males)  Tried probiotics, no help; takes for a week or so No real dyspepsia now; took protonix, no change Avoiding NSAIDs and alcohol Bloating and gas somewhat, not too bad, not daily, just when she gets flares; does not think ovarian; seeing GYN regularly Some rumbling of GI Some mucous in the stool No blood in the stool Just had one episode of nausea and vomiting, could not hold down food; then got on antibiotics for a cold and that cleared it up, one year ago; no other flares like that  She has hypothyroidism, but last thyroid numbers within normal range; does not think over-replacement Lab Results  Component Value Date   TSH 2.93 01/10/2018   T4TOTAL 7.3 11/25/2015     Depression screen [facility] 2/9 04/27/2018 04/02/2017 10/26/2016 09/28/2016 07/14/2016  Decreased Interest 0 0 0 0 0    Down, Depressed, Hopeless 0 0 0 0 0  PHQ - 2 Score 0 0 0 0 0  Altered sleeping 0 - - - -  Tired, decreased energy 0 - - - -  Change in appetite 0 - - - -  Feeling bad or failure about yourself  0 - - - -  Trouble concentrating 0 - - - -  Moving slowly or fidgety/restless 0 - - - -  Suicidal thoughts 0 - - - -  PHQ-9 Score 0 - - - -  Difficult doing work/chores Not difficult at all - - - -   Fall Risk  04/27/2018 04/15/2017 04/02/2017 10/26/2016 09/28/2016  Falls in the past year? 0 No No No No  Number falls in past yr: 0 - - - -  Injury with Fall? 0 - - - -    Relevant past medical, surgical, family and social history reviewed Past Medical History:  Diagnosis Date  . Allergy   . Anemia   . Generalized anxiety disorder 10/26/2016  . Headache   . Hypothyroidism 08/14/2015  . Hypothyroidism due to Hashimoto's thyroiditis 09/07/2015  . Iron deficiency anemia 08/14/2015  . Uses central nervous system stimulants 03/12/2016  . Vitamin D deficiency 08/14/2015   Past Surgical History:  Procedure Laterality Date  . CESAREAN SECTION  Family History  Problem Relation Age of Onset  . Cancer Father        LUNG  . Thyroid disease Mother   . Cancer Paternal Grandmother        breast cancer  . Diabetes Sister   . Diabetes Brother   . Heart disease Brother        from his diabetes, heart attack  . Hypertension Brother   . Kidney disease Brother   . Cancer Maternal Aunt        UTERINE  . Eczema Daughter   . Asthma Daughter   . Heart attack Maternal Grandfather   . Cancer Paternal Grandfather        lung   Social History   Tobacco Use  . Smoking status: Former Research scientist (life sciences)  . Smokeless tobacco: Never Used  Substance Use Topics  . Alcohol use: Yes    Alcohol/week: 0.0 standard drinks    Comment: an ocassional glass of wine  . Drug use: No     Office Visit from 04/27/2018 in [facility]  AUDIT-C Score  1      Interim medical history since last visit  reviewed. Allergies and medications reviewed  Review of Systems Per HPI unless specifically indicated above     Objective:    BP 120/72   Pulse 78   Temp 97.6 F (36.4 C) (Oral)   Resp 12   Ht 5' 8"  (1.727 m)   Wt 177 lb 14.4 oz (80.7 kg)   SpO2 98%   BMI 27.05 kg/m   Wt Readings from Last 3 Encounters:  04/27/18 177 lb 14.4 oz (80.7 kg)  06/16/17 167 lb 3.2 oz (75.8 kg)  05/27/17 171 lb 9.6 oz (77.8 kg)    Physical Exam Constitutional:      General: She is not in acute distress.    Appearance: She is well-developed. She is not diaphoretic.  HENT:     Head: Normocephalic and atraumatic.  Eyes:     General: No scleral icterus. Neck:     Thyroid: No thyromegaly.  Cardiovascular:     Rate and Rhythm: Normal rate and regular rhythm.     Heart sounds: Normal heart sounds. No murmur.  Pulmonary:     Effort: Pulmonary effort is normal. No respiratory distress.     Breath sounds: Normal breath sounds. No wheezing.  Abdominal:     General: Bowel sounds are normal. There is no distension.     Palpations: Abdomen is soft.     Tenderness: There is no abdominal tenderness.  Skin:    General: Skin is warm and dry.     Coloration: Skin is not pale.  Neurological:     Mental Status: She is alert.  Psychiatric:        Behavior: Behavior normal.        Thought Content: Thought content normal.        Judgment: Judgment normal.    Results for orders placed or performed in visit on 01/10/18  T4, free  Result Value Ref Range   Free T4 1.2 0.8 - 1.8 ng/dL  TSH  Result Value Ref Range   TSH 2.93 mIU/L      Assessment & Plan:   Problem List Items Addressed This Visit      Endocrine   Hypothyroidism due to Hashimoto's thyroiditis    Normal thyroid labs not long ago, so don't think GI symptoms are thyroid-related       Other Visit Diagnoses  Diarrhea, unspecified type    -  Primary   may be IBS; she has seen 2 GI specialists; has not had colonoscopy; will try  hyoscyamine; continue SSRI; labs today; close f/u; colonoscopy considered   Relevant Orders   CALPROTECTIN   COMPLETE METABOLIC PANEL WITH GFR   Celiac Disease Comprehensive Panel w Reflexes, Infant   Microcytic hypochromic anemia       Relevant Orders   CBC with Differential/Platelet   Abdominal bloating       try low FODMAP diet       Follow up plan: Return in about 4 weeks (around 05/25/2018) for follow-up visit with Dr. Sanda Klein.  An after-visit summary was printed and given to the patient at [facility].  Please see the patient instructions which may contain other information and recommendations beyond what is mentioned above in the assessment and plan.  Meds ordered this encounter  Medications  . hyoscyamine (LEVSIN, ANASPAZ) 0.125 MG tablet    Sig: Take 1 tablet (0.125 mg total) by mouth every 6 (six) hours as needed.    Dispense:  30 tablet    Refill:  0    Orders Placed This Encounter  Procedures  . CALPROTECTIN  . CBC with Differential/Platelet  . COMPLETE METABOLIC PANEL WITH GFR  . Celiac Disease Comprehensive Panel w Reflexes, Infant

## 2018-04-27 NOTE — Patient Instructions (Addendum)
I'll suggest a low FODMAP diet   Low-FODMAP Eating Plan  FODMAPs (fermentable oligosaccharides, disaccharides, monosaccharides, and polyols) are sugars that are hard for some people to digest. A low-FODMAP eating plan may help some people who have bowel (intestinal) diseases to manage their symptoms. This meal plan can be complicated to follow. Work with a diet and nutrition specialist (dietitian) to make a low-FODMAP eating plan that is right for you. A dietitian can make sure that you get enough nutrition from this diet. What are tips for following this plan? Reading food labels  Check labels for hidden FODMAPs such as: ? High-fructose syrup. ? Honey. ? Agave. ? Natural fruit flavors. ? Onion or garlic powder.  Choose low-FODMAP foods that contain 3-4 grams of fiber per serving.  Check food labels for serving sizes. Eat only one serving at a time to make sure FODMAP levels stay low. Meal planning  Follow a low-FODMAP eating plan for up to 6 weeks, or as told by your health care provider or dietitian.  To follow the eating plan: 1. Eliminate high-FODMAP foods from your diet completely. 2. Gradually reintroduce high-FODMAP foods into your diet one at a time. Most people should wait a few days after introducing one high-FODMAP food before they introduce the next high-FODMAP food. Your dietitian can recommend how quickly you may reintroduce foods. 3. Keep a daily record of what you eat and drink, and make note of any symptoms that you have after eating. 4. Review your daily record with a dietitian regularly. Your dietitian can help you identify which foods you can eat and which foods you should avoid. General tips  Drink enough fluid each day to keep your urine pale yellow.  Avoid processed foods. These often have added sugar and may be high in FODMAPs.  Avoid most dairy products, whole grains, and sweeteners.  Work with a dietitian to make sure you get enough fiber in your  diet. Recommended foods Grains  Gluten-free grains, such as rice, oats, buckwheat, quinoa, corn, polenta, and millet. Gluten-free pasta, bread, or cereal. Rice noodles. Corn tortillas. Vegetables  Eggplant, zucchini, cucumber, peppers, green beans, Brussels sprouts, bean sprouts, lettuce, arugula, kale, Swiss chard, spinach, collard greens, bok choy, summer squash, potato, and tomato. Limited amounts of corn, carrot, and sweet potato. Green parts of scallions. Fruits  Bananas, oranges, lemons, limes, blueberries, raspberries, strawberries, grapes, cantaloupe, honeydew melon, kiwi, papaya, passion fruit, and pineapple. Limited amounts of dried cranberries, banana chips, and shredded coconut. Dairy  Lactose-free milk, yogurt, and kefir. Lactose-free cottage cheese and ice cream. Non-dairy milks, such as almond, coconut, hemp, and rice milk. Yogurts made of non-dairy milks. Limited amounts of goat cheese, brie, mozzarella, parmesan, swiss, and other hard cheeses. Meats and other protein foods  Unseasoned beef, pork, poultry, or fish. Eggs. Berniece Salines. Tofu (firm) and tempeh. Limited amounts of nuts and seeds, such as almonds, walnuts, [place] nuts, pecans, peanuts, pumpkin seeds, chia seeds, and sunflower seeds. Fats and oils  Butter-free spreads. Vegetable oils, such as olive, canola, and sunflower oil. Seasoning and other foods  Artificial sweeteners with names that do not end in "ol" such as aspartame, saccharine, and stevia. Maple syrup, white table sugar, raw sugar, brown sugar, and molasses. Fresh basil, coriander, parsley, rosemary, and thyme. Beverages  Water and mineral water. Sugar-sweetened soft drinks. Small amounts of orange juice or cranberry juice. Black and green tea. Most dry wines. Coffee. This may not be a complete list of low-FODMAP foods. Talk with your dietitian for  more information. Foods to avoid Grains  Wheat, including kamut, durum, and semolina. Barley and bulgur.  Couscous. Wheat-based cereals. Wheat noodles, bread, crackers, and pastries. Vegetables  Chicory root, artichoke, asparagus, cabbage, snow peas, sugar snap peas, mushrooms, and cauliflower. Onions, garlic, leeks, and the white part of scallions. Fruits  Fresh, dried, and juiced forms of apple, pear, watermelon, peach, plum, cherries, apricots, blackberries, boysenberries, figs, nectarines, and mango. Avocado. Dairy  Milk, yogurt, ice cream, and soft cheese. Cream and sour cream. Milk-based sauces. Custard. Meats and other protein foods  Fried or fatty meat. Sausage. Cashews and pistachios. Soybeans, baked beans, black beans, chickpeas, kidney beans, fava beans, navy beans, lentils, and split peas. Seasoning and other foods  Any sugar-free gum or candy. Foods that contain artificial sweeteners such as sorbitol, mannitol, isomalt, or xylitol. Foods that contain honey, high-fructose corn syrup, or agave. Bouillon, vegetable stock, beef stock, and chicken stock. Garlic and onion powder. Condiments made with onion, such as hummus, chutney, pickles, relish, salad dressing, and salsa. Tomato paste. Beverages  Chicory-based drinks. Coffee substitutes. Chamomile tea. Fennel tea. Sweet or fortified wines such as port or sherry. Diet soft drinks made with isomalt, mannitol, maltitol, sorbitol, or xylitol. Apple, pear, and mango juice. Juices with high-fructose corn syrup. This may not be a complete list of high-FODMAP foods. Talk with your dietitian to discuss what dietary choices are best for you.  Summary  A low-FODMAP eating plan is a short-term diet that eliminates FODMAPs from your diet to help ease symptoms of certain bowel diseases.  The eating plan usually lasts up to 6 weeks. After that, high-FODMAP foods are restarted gradually, one at a time, so you can find out which may be causing symptoms.  A low-FODMAP eating plan can be complicated. It is best to work with a dietitian who has  experience with this type of plan. This information is not intended to replace advice given to you by your health care provider. Make sure you discuss any questions you have with your health care provider. Document Released: 10/20/2016 Document Revised: 10/20/2016 Document Reviewed: 10/20/2016 Elsevier Interactive Patient Education  [employer].

## 2018-04-28 ENCOUNTER — Encounter: Payer: Self-pay | Admitting: Family Medicine

## 2018-04-28 ENCOUNTER — Other Ambulatory Visit: Payer: Self-pay | Admitting: Family Medicine

## 2018-04-28 DIAGNOSIS — D509 Iron deficiency anemia, unspecified: Secondary | ICD-10-CM

## 2018-04-28 LAB — COMPLETE METABOLIC PANEL WITH GFR
AG Ratio: 1.4 (calc) (ref 1.0–2.5)
ALT: 14 U/L (ref 6–29)
AST: 17 U/L (ref 10–30)
Albumin: 3.9 g/dL (ref 3.6–5.1)
Alkaline phosphatase (APISO): 54 U/L (ref 31–125)
BUN: 9 mg/dL (ref 7–25)
CO2: 26 mmol/L (ref 20–32)
Calcium: 9.2 mg/dL (ref 8.6–10.2)
Chloride: 103 mmol/L (ref 98–110)
Creat: 0.65 mg/dL (ref 0.50–1.10)
GFR, Est African American: 131 mL/min/{1.73_m2} (ref >=60)
GFR, Est Non African American: 113 mL/min/{1.73_m2} (ref >=60)
Globulin: 2.7 g/dL (calc) (ref 1.9–3.7)
Glucose, Bld: 67 mg/dL (ref 65–99)
Potassium: 4.3 mmol/L (ref 3.5–5.3)
Sodium: 137 mmol/L (ref 135–146)
Total Bilirubin: 0.2 mg/dL (ref 0.2–1.2)
Total Protein: 6.6 g/dL (ref 6.1–8.1)

## 2018-04-28 LAB — CBC WITH DIFFERENTIAL/PLATELET
Absolute Monocytes: 602 cells/uL (ref 200–950)
Basophils Absolute: 58 cells/uL (ref 0–200)
Basophils Relative: 0.9 %
Eosinophils Absolute: 486 cells/uL (ref 15–500)
Eosinophils Relative: 7.6 %
HCT: 33.3 % — ABNORMAL LOW (ref 35.0–45.0)
Hemoglobin: 10.2 g/dL — ABNORMAL LOW (ref 11.7–15.5)
Lymphs Abs: 1197 cells/uL (ref 850–3900)
MCH: 23.2 pg — ABNORMAL LOW (ref 27.0–33.0)
MCHC: 30.6 g/dL — ABNORMAL LOW (ref 32.0–36.0)
MCV: 75.9 fL — ABNORMAL LOW (ref 80.0–100.0)
MPV: 9.8 fL (ref 7.5–12.5)
Monocytes Relative: 9.4 %
Neutro Abs: 4058 cells/uL (ref 1500–7800)
Neutrophils Relative %: 63.4 %
Platelets: 430 10*3/uL — ABNORMAL HIGH (ref 140–400)
RBC: 4.39 10*6/uL (ref 3.80–5.10)
RDW: 14.1 % (ref 11.0–15.0)
Total Lymphocyte: 18.7 %
WBC: 6.4 10*3/uL (ref 3.8–10.8)

## 2018-04-28 LAB — CELIAC DISEASE COMPREHENSIVE PANEL WITH REFLEXES
(tTG) Ab, IgA: 1 U/mL
Immunoglobulin A: 160 mg/dL (ref 47–310)

## 2018-04-28 NOTE — Assessment & Plan Note (Signed)
Check CBC, iron studies in 6 weeks; patient to see GI, start iron therapy

## 2018-04-29 ENCOUNTER — Other Ambulatory Visit: Payer: Self-pay | Admitting: Obstetrics and Gynecology

## 2018-04-29 MED ORDER — FLIBANSERIN 100 MG PO TABS: 1.0000 | ORAL_TABLET | Freq: Every day | ORAL | 6 refills | Status: DC

## 2018-05-06 ENCOUNTER — Encounter: Payer: Self-pay | Admitting: Family Medicine

## 2018-05-13 ENCOUNTER — Other Ambulatory Visit: Payer: Self-pay | Admitting: Neurology

## 2018-05-13 ENCOUNTER — Other Ambulatory Visit: Payer: Self-pay | Admitting: *Deleted

## 2018-05-13 ENCOUNTER — Other Ambulatory Visit: Payer: Self-pay | Admitting: Family Medicine

## 2018-05-13 ENCOUNTER — Telehealth: Payer: Self-pay | Admitting: Neurology

## 2018-05-13 MED ORDER — DESOGESTREL-ETHINYL ESTRADIOL 0.15-0.02/0.01 MG (21/5) PO TABS: 1.0000 | ORAL_TABLET | Freq: Every day | ORAL | 0 refills | Status: DC

## 2018-05-13 NOTE — Telephone Encounter (Signed)
Lab Results  Component Value Date   TSH 2.93 01/10/2018   T4TOTAL 7.3 11/25/2015

## 2018-05-13 NOTE — Telephone Encounter (Signed)
Patient wants to know why her Lexapro 26m could not be refilled by [facility]. She states they told her we would not approve the refill

## 2018-05-16 ENCOUNTER — Telehealth: Payer: Self-pay | Admitting: Neurology

## 2018-05-16 NOTE — Telephone Encounter (Signed)
Patient left vm about denial for refill on the Lexapro medication. Please call her back. Thanks!

## 2018-05-16 NOTE — Telephone Encounter (Signed)
Pt LOV was February 2019, with instructions to return in 3 months.  Pt does not have f/u appt scheduled.  Office policy states no refills given if pt not seen in last 12 months.  Will send Rx once pt has f/u appt scheduled.  But I will only send enough to last until that appt date/time.  New Rx will be sent during appointment.

## 2018-05-16 NOTE — Telephone Encounter (Signed)
Patient is calling in about the medication and she only has one day left of meds. Please send in. Thanks!

## 2018-05-17 NOTE — Telephone Encounter (Signed)
Patient is scheduled for 12/27/18. Please send in enough meds to last her until then. Thanks!

## 2018-05-18 MED ORDER — ESCITALOPRAM OXALATE 10 MG PO TABS: ORAL_TABLET | ORAL | 11 refills | Status: DC

## 2018-05-18 NOTE — Telephone Encounter (Signed)
Patient has called back regarding her Lexapro medication. [facility] [place] [facility] [address]. She has only one pill left. Thanks

## 2018-05-18 NOTE — Telephone Encounter (Signed)
Lexapro 78m #60 with 11 refills Sig = take 1 tab BID  Sent to [facility]

## 2018-05-18 NOTE — Telephone Encounter (Signed)
Rx was sent at 11:06 this AM.  Pt should contact pharmacy.

## 2018-05-18 NOTE — Addendum Note (Signed)
Addended by: Horatio Pel on: 05/18/2018 11:07 AM   Modules accepted: Orders

## 2018-05-25 ENCOUNTER — Ambulatory Visit: Payer: 59 | Admitting: Family Medicine

## 2018-05-26 ENCOUNTER — Other Ambulatory Visit: Payer: Self-pay | Admitting: Obstetrics and Gynecology

## 2018-05-30 ENCOUNTER — Other Ambulatory Visit: Payer: Self-pay | Admitting: *Deleted

## 2018-05-30 MED ORDER — DESOGESTREL-ETHINYL ESTRADIOL 0.15-0.02/0.01 MG (21/5) PO TABS: ORAL_TABLET | ORAL | 4 refills | Status: DC

## 2018-06-03 ENCOUNTER — Other Ambulatory Visit: Payer: Self-pay

## 2018-06-03 MED ORDER — DESOGESTREL-ETHINYL ESTRADIOL 0.15-0.02/0.01 MG (21/5) PO TABS: ORAL_TABLET | ORAL | 1 refills | Status: DC

## 2018-06-09 ENCOUNTER — Encounter: Payer: 59 | Admitting: Obstetrics and Gynecology

## 2018-06-14 ENCOUNTER — Ambulatory Visit: Payer: 59

## 2018-06-21 ENCOUNTER — Encounter: Payer: Self-pay | Admitting: Family Medicine

## 2018-08-04 ENCOUNTER — Encounter: Payer: Self-pay | Admitting: Obstetrics and Gynecology

## 2018-09-02 ENCOUNTER — Encounter: Admission: RE | Admit: 2018-09-02 | Payer: 59 | Source: Ambulatory Visit

## 2018-09-02 ENCOUNTER — Encounter: Payer: Self-pay | Admitting: Nurse Practitioner

## 2018-09-02 ENCOUNTER — Ambulatory Visit (INDEPENDENT_AMBULATORY_CARE_PROVIDER_SITE_OTHER): Payer: 59 | Admitting: Nurse Practitioner

## 2018-09-02 ENCOUNTER — Other Ambulatory Visit: Payer: Self-pay

## 2018-09-02 VITALS — Resp 16 | Ht 68.0 in | Wt 167.0 lb

## 2018-09-02 DIAGNOSIS — H9201 Otalgia, right ear: Secondary | ICD-10-CM | POA: Diagnosis not present

## 2018-09-02 DIAGNOSIS — R591 Generalized enlarged lymph nodes: Secondary | ICD-10-CM

## 2018-09-02 MED ORDER — AMOXICILLIN-POT CLAVULANATE 875-125 MG PO TABS: 1.0000 | ORAL_TABLET | Freq: Two times a day (BID) | ORAL | 0 refills | Status: DC

## 2018-09-02 NOTE — Progress Notes (Signed)
Virtual Visit via Video Note  I connected with Masako Overall on 09/02/18 at 12:20 PM EDT by a video enabled telemedicine application and verified that I am speaking with the correct person using two identifiers.   Staff discussed the limitations of evaluation and management by telemedicine and the availability of in person appointments. The patient expressed understanding and agreed to proceed.  Patient location: home  My location: work office Other people present:  none HPI  Patient endorses right sided neck pain and ear pain . Right neck is tender- thought she slept wrong initially but area is still tender. Felt small bean sized nodule on right side of neck earlier. Right ear feels full and like there is water in it and has some mild pain. No drainage.  Endorses mild rhinorrhea and sneezing- has seasonal allergies.  Denies sore throat, pain with swallowing, shortness of breath, fevers, chills, sinus pain or pressure.     PHQ2/9: Depression screen [facility] 2/9 09/02/2018 04/27/2018 04/02/2017 10/26/2016 09/28/2016  Decreased Interest 0 0 0 0 0  Down, Depressed, Hopeless 0 0 0 0 0  PHQ - 2 Score 0 0 0 0 0  Altered sleeping 0 0 - - -  Tired, decreased energy 0 0 - - -  Change in appetite 0 0 - - -  Feeling bad or failure about yourself  0 0 - - -  Trouble concentrating 0 0 - - -  Moving slowly or fidgety/restless 0 0 - - -  Suicidal thoughts 0 0 - - -  PHQ-9 Score 0 0 - - -  Difficult doing work/chores Not difficult at all Not difficult at all - - -     PHQ reviewed. Negative  Patient Active Problem List   Diagnosis Date Noted  . CRP elevated 04/27/2017  . Joint stiffness of knee, unspecified laterality 04/27/2017  . Cyclic citrullinated peptide (CCP) antibody positive 04/06/2017  . Generalized anxiety disorder 10/26/2016  . BRBPR (bright red blood per rectum) 09/28/2016  . Sleep apnea 12/24/2015  . Hypothyroidism due to Hashimoto's thyroiditis 09/07/2015  . Chronically dry eyes  09/06/2015  . Vitamin D deficiency 08/14/2015  . Anemia, iron deficiency 08/14/2015    Past Medical History:  Diagnosis Date  . Allergy   . Anemia   . Generalized anxiety disorder 10/26/2016  . Headache   . Hypothyroidism 08/14/2015  . Hypothyroidism due to Hashimoto's thyroiditis 09/07/2015  . Iron deficiency anemia 08/14/2015  . Uses central nervous system stimulants 03/12/2016  . Vitamin D deficiency 08/14/2015    Past Surgical History:  Procedure Laterality Date  . CESAREAN SECTION      Social History   Tobacco Use  . Smoking status: Former Research scientist (life sciences)  . Smokeless tobacco: Never Used  Substance Use Topics  . Alcohol use: Yes    Alcohol/week: 0.0 standard drinks    Comment: an ocassional glass of wine     Current Outpatient Medications:  .  desogestrel-ethinyl estradiol (VIORELE) 0.15-0.02/0.01 MG (21/5) tablet, TAKE 1 TABLET DAILY, SKIP  PLACEBO TABLETS, Disp: 28 tablet, Rfl: 1 .  escitalopram (LEXAPRO) 10 MG tablet, Take 2 tablets daily, Disp: 60 tablet, Rfl: 11 .  hyoscyamine (LEVSIN, ANASPAZ) 0.125 MG tablet, Take 1 tablet (0.125 mg total) by mouth every 6 (six) hours as needed., Disp: 30 tablet, Rfl: 0 .  levothyroxine (SYNTHROID, LEVOTHROID) 112 MCG tablet, TAKE 1 TABLET(112 MCG) BY MOUTH EVERY OTHER DAY, Disp: 45 tablet, Rfl: 1 .  levothyroxine (SYNTHROID, LEVOTHROID) 125 MCG tablet, TAKE 1 TABLET  BY MOUTH EVERY OTHER DAY. ALTERNATE WITH 112 MCG DOSE, Disp: 45 tablet, Rfl: 1 .  Lifitegrast (XIIDRA) 5 % SOLN, Apply to eye., Disp: , Rfl:  .  LORazepam (ATIVAN) 0.5 MG tablet, Take 0.5-1 tablets (0.25-0.5 mg total) by mouth 2 (two) times daily as needed for anxiety. No alcohol, Disp: 8 tablet, Rfl: 0 .  amoxicillin-clavulanate (AUGMENTIN) 875-125 MG tablet, Take 1 tablet by mouth 2 (two) times daily., Disp: 20 tablet, Rfl: 0 .  Flibanserin (ADDYI) 100 MG TABS, Take 1 tablet by mouth at bedtime. (Patient not taking: Reported on 09/02/2018), Disp: 30 tablet, Rfl: 6  No Known  Allergies  ROS   No other specific complaints in a complete review of systems (except as listed in HPI above).  Objective  Vitals:   09/02/18 1147 09/02/18 1148  Resp:  16  Weight: 167 lb (75.8 kg)   Height: 5\' 8"  (1.727 m)      Body mass index is 25.39 kg/m.  Nursing Note and Vital Signs reviewed.  Physical Exam   Constitutional: Patient appears well-developed and well-nourished. No distress.  HENT: Head: Normocephalic and atraumatic. No frontal or maxillary tenderness.  Pulmonary/Chest: Effort normal  Musculoskeletal: Normal range of motion,  Neurological: alert and oriented, speech normal.  Skin: No rash noted. No erythema.  Psychiatric: Patient has a normal mood and affect. behavior is normal. Judgment and thought content normal.    Assessment & Plan  1. Ear pain, right - amoxicillin-clavulanate (AUGMENTIN) 875-125 MG tablet; Take 1 tablet by mouth 2 (two) times daily.  Dispense: 20 tablet; Refill: 0  2. Lymphadenopathy - amoxicillin-clavulanate (AUGMENTIN) 875-125 MG tablet; Take 1 tablet by mouth 2 (two) times daily.  Dispense: 20 tablet; Refill: 0   Follow Up Instructions:   PRN  I discussed the assessment and treatment plan with the patient. The patient was provided an opportunity to ask questions and all were answered. The patient agreed with the plan and demonstrated an understanding of the instructions.   The patient was advised to call back or seek an in-person evaluation if the symptoms worsen or if the condition fails to improve as anticipated.  I provided 12 minutes of non-face-to-face time during this encounter.   Cheryle HorsfallElizabeth E Sylus Stgermain, NP

## 2018-09-20 ENCOUNTER — Other Ambulatory Visit
Admission: RE | Admit: 2018-09-20 | Discharge: 2018-09-20 | Disposition: A | Discharge status: Home / Self Care | Payer: 59 | Source: Ambulatory Visit | Attending: Unknown Physician Specialty | Admitting: Unknown Physician Specialty

## 2018-09-23 ENCOUNTER — Encounter: Admission: RE | Payer: Self-pay | Source: Home / Self Care

## 2018-09-23 ENCOUNTER — Ambulatory Visit: Admission: RE | Admit: 2018-09-23 | Payer: 59 | Source: Home / Self Care | Admitting: Unknown Physician Specialty

## 2018-09-23 ENCOUNTER — Ambulatory Visit: Admission: RE | Admit: 2018-09-23 | Payer: 59 | Source: Home / Self Care | Admitting: Internal Medicine

## 2018-09-23 SURGERY — ESOPHAGOGASTRODUODENOSCOPY (EGD) WITH PROPOFOL
Anesthesia: General

## 2018-09-23 SURGERY — COLONOSCOPY WITH PROPOFOL
Anesthesia: General

## 2018-09-29 LAB — HM COLONOSCOPY

## 2018-10-13 ENCOUNTER — Encounter: Payer: Self-pay | Admitting: Obstetrics and Gynecology

## 2018-10-19 ENCOUNTER — Encounter: Payer: Self-pay | Admitting: Obstetrics and Gynecology

## 2018-10-28 ENCOUNTER — Other Ambulatory Visit (HOSPITAL_COMMUNITY)
Admission: RE | Admit: 2018-10-28 | Discharge: 2018-10-28 | Disposition: A | Discharge status: Home / Self Care | Payer: 59 | Source: Ambulatory Visit | Attending: Obstetrics and Gynecology | Admitting: Obstetrics and Gynecology

## 2018-10-28 ENCOUNTER — Encounter: Payer: Self-pay | Admitting: Obstetrics and Gynecology

## 2018-10-28 ENCOUNTER — Ambulatory Visit (INDEPENDENT_AMBULATORY_CARE_PROVIDER_SITE_OTHER): Payer: 59 | Admitting: Obstetrics and Gynecology

## 2018-10-28 ENCOUNTER — Other Ambulatory Visit: Payer: Self-pay

## 2018-10-28 VITALS — BP 122/86 | HR 88 | Ht 68.0 in | Wt 170.2 lb

## 2018-10-28 DIAGNOSIS — E538 Deficiency of other specified B group vitamins: Secondary | ICD-10-CM

## 2018-10-28 DIAGNOSIS — Z01419 Encounter for gynecological examination (general) (routine) without abnormal findings: Secondary | ICD-10-CM | POA: Insufficient documentation

## 2018-10-28 DIAGNOSIS — D509 Iron deficiency anemia, unspecified: Secondary | ICD-10-CM | POA: Diagnosis not present

## 2018-10-28 DIAGNOSIS — K633 Ulcer of intestine: Secondary | ICD-10-CM | POA: Diagnosis not present

## 2018-10-28 DIAGNOSIS — E039 Hypothyroidism, unspecified: Secondary | ICD-10-CM

## 2018-10-28 MED ORDER — DESOGESTREL-ETHINYL ESTRADIOL 0.15-0.02/0.01 MG (21/5) PO TABS: ORAL_TABLET | ORAL | 11 refills | Status: DC

## 2018-10-28 NOTE — Progress Notes (Signed)
Subjective:   Melissa Harrison is a 38 y.o. G62P0 Caucasian female here for a routine well-woman exam.  No LMP recorded. (Menstrual status: Oral contraceptives).    Current complaints: none PCP: [facility]       Does need labs  Social History: Sexual: heterosexual Marital Status: married Living situation: with family Occupation: Ship broker Tobacco/alcohol: no tobacco use Illicit drugs: no history of illicit drug use  The following portions of the patient's history were reviewed and updated as appropriate: allergies, current medications, past family history, past medical history, past social history, past surgical history and problem list.  Past Medical History Past Medical History:  Diagnosis Date  . Allergy   . Anemia   . Generalized anxiety disorder 10/26/2016  . Headache   . Hypothyroidism 08/14/2015  . Hypothyroidism due to Hashimoto's thyroiditis 09/07/2015  . Iron deficiency anemia 08/14/2015  . Uses central nervous system stimulants 03/12/2016  . Vitamin D deficiency 08/14/2015    Past Surgical History Past Surgical History:  Procedure Laterality Date  . CESAREAN SECTION      Gynecologic History G1P0  No LMP recorded. (Menstrual status: Oral contraceptives). Contraception: OCP (estrogen/progesterone) Last Pap: 2017. Results were: normal   Obstetric History OB History  Gravida Para Term Preterm AB Living  1         1  SAB TAB Ectopic Multiple Live Births          1    # Outcome Date GA Lbr Len/2nd Weight Sex Delivery Anes PTL Lv  1 Gravida 11/11/12     CS-Unspec   LIV    Current Medications Current Outpatient Medications on File Prior to Visit  Medication Sig Dispense Refill  . desogestrel-ethinyl estradiol (VIORELE) 0.15-0.02/0.01 MG (21/5) tablet TAKE 1 TABLET DAILY, SKIP  PLACEBO TABLETS 28 tablet 1  . escitalopram (LEXAPRO) 10 MG tablet Take 2 tablets daily 60 tablet 11  . hyoscyamine (LEVSIN, ANASPAZ) 0.125 MG tablet Take 1 tablet (0.125 mg total) by  mouth every 6 (six) hours as needed. 30 tablet 0  . levothyroxine (SYNTHROID, LEVOTHROID) 112 MCG tablet TAKE 1 TABLET(112 MCG) BY MOUTH EVERY OTHER DAY 45 tablet 1  . levothyroxine (SYNTHROID, LEVOTHROID) 125 MCG tablet TAKE 1 TABLET BY MOUTH EVERY OTHER DAY. ALTERNATE WITH 112 MCG DOSE 45 tablet 1  . Lifitegrast (XIIDRA) 5 % SOLN Apply to eye.    Marland Kitchen amoxicillin-clavulanate (AUGMENTIN) 875-125 MG tablet Take 1 tablet by mouth 2 (two) times daily. (Patient not taking: Reported on 10/28/2018) 20 tablet 0  . Flibanserin (ADDYI) 100 MG TABS Take 1 tablet by mouth at bedtime. (Patient not taking: Reported on 09/02/2018) 30 tablet 6  . LORazepam (ATIVAN) 0.5 MG tablet Take 0.5-1 tablets (0.25-0.5 mg total) by mouth 2 (two) times daily as needed for anxiety. No alcohol (Patient not taking: Reported on 10/28/2018) 8 tablet 0   No current facility-administered medications on file prior to visit.     Review of Systems Patient denies any headaches, blurred vision, shortness of breath, chest pain, abdominal pain, problems with bowel movements, urination, or intercourse.  Objective:  BP 122/86   Pulse 88   Ht 5' 8"  (1.727 m)   Wt 170 lb 3.2 oz (77.2 kg)   BMI 25.88 kg/m  Physical Exam  General:  Well developed, well nourished, no acute distress. She is alert and oriented x3. Skin:  Warm and dry Neck:  Midline trachea, no thyromegaly or nodules Cardiovascular: Regular rate and rhythm, no murmur heard Lungs:  Effort  normal, all lung fields clear to auscultation bilaterally Breasts:  No dominant palpable mass, retraction, or nipple discharge Abdomen:  Soft, non tender, no hepatosplenomegaly or masses Pelvic:  External genitalia is normal in appearance.  The vagina is normal in appearance. The cervix is bulbous, no CMT.  Thin prep pap is done with HR HPV cotesting. Uterus is felt to be normal size, shape, and contour.  No adnexal masses or tenderness noted. Extremities:  No swelling or varicosities  noted Psych:  She has a normal mood and affect  Assessment:   Healthy well-woman exam Thyroid disorder Anemia B12 deficiency Ulcerative colitis  Plan:  Labs obtained- will follow up accordingly F/U 1 year for AE, or sooner if needed Mammogram baseline ordered  Meili Kleckley Rockney Ghee, CNM

## 2018-10-28 NOTE — Patient Instructions (Signed)
 Preventive Care 21-39 Years Old, Female Preventive care refers to visits with your health care provider and lifestyle choices that can promote health and wellness. This includes:  A yearly physical exam. This may also be called an annual well check.  Regular dental visits and eye exams.  Immunizations.  Screening for certain conditions.  Healthy lifestyle choices, such as eating a healthy diet, getting regular exercise, not using drugs or products that contain nicotine and tobacco, and limiting alcohol use. What can I expect for my preventive care visit? Physical exam Your health care provider will check your:  Height and weight. This may be used to calculate body mass index (BMI), which tells if you are at a healthy weight.  Heart rate and blood pressure.  Skin for abnormal spots. Counseling Your health care provider may ask you questions about your:  Alcohol, tobacco, and drug use.  Emotional well-being.  Home and relationship well-being.  Sexual activity.  Eating habits.  Work and work environment.  Method of birth control.  Menstrual cycle.  Pregnancy history. What immunizations do I need?  Influenza (flu) vaccine  This is recommended every year. Tetanus, diphtheria, and pertussis (Tdap) vaccine  You may need a Td booster every 10 years. Varicella (chickenpox) vaccine  You may need this if you have not been vaccinated. Human papillomavirus (HPV) vaccine  If recommended by your health care provider, you may need three doses over 6 months. Measles, mumps, and rubella (MMR) vaccine  You may need at least one dose of MMR. You may also need a second dose. Meningococcal conjugate (MenACWY) vaccine  One dose is recommended if you are age 19-21 years and a first-year college student living in a residence hall, or if you have one of several medical conditions. You may also need additional booster doses. Pneumococcal conjugate (PCV13) vaccine  You may need  this if you have certain conditions and were not previously vaccinated. Pneumococcal polysaccharide (PPSV23) vaccine  You may need one or two doses if you smoke cigarettes or if you have certain conditions. Hepatitis A vaccine  You may need this if you have certain conditions or if you travel or work in places where you may be exposed to hepatitis A. Hepatitis B vaccine  You may need this if you have certain conditions or if you travel or work in places where you may be exposed to hepatitis B. Haemophilus influenzae type b (Hib) vaccine  You may need this if you have certain conditions. You may receive vaccines as individual doses or as more than one vaccine together in one shot (combination vaccines). Talk with your health care provider about the risks and benefits of combination vaccines. What tests do I need?  Blood tests  Lipid and cholesterol levels. These may be checked every 5 years starting at age 20.  Hepatitis C test.  Hepatitis B test. Screening  Diabetes screening. This is done by checking your blood sugar (glucose) after you have not eaten for a while (fasting).  Sexually transmitted disease (STD) testing.  BRCA-related cancer screening. This may be done if you have a family history of breast, ovarian, tubal, or peritoneal cancers.  Pelvic exam and Pap test. This may be done every 3 years starting at age 21. Starting at age 30, this may be done every 5 years if you have a Pap test in combination with an HPV test. Talk with your health care provider about your test results, treatment options, and if necessary, the need for more   tests. Follow these instructions at home: Eating and drinking   Eat a diet that includes fresh fruits and vegetables, whole grains, lean protein, and low-fat dairy.  Take vitamin and mineral supplements as recommended by your health care provider.  Do not drink alcohol if: ? Your health care provider tells you not to drink. ? You are  pregnant, may be pregnant, or are planning to become pregnant.  If you drink alcohol: ? Limit how much you have to 0-1 drink a day. ? Be aware of how much alcohol is in your drink. In the [place] one drink equals one 12 oz bottle of beer (355 mL), one 5 oz glass of wine (148 mL), or one 1 oz glass of hard liquor (44 mL). Lifestyle  Take daily care of your teeth and gums.  Stay active. Exercise for at least 30 minutes on 5 or more days each week.  Do not use any products that contain nicotine or tobacco, such as cigarettes, e-cigarettes, and chewing tobacco. If you need help quitting, ask your health care provider.  If you are sexually active, practice safe sex. Use a condom or other form of birth control (contraception) in order to prevent pregnancy and STIs (sexually transmitted infections). If you plan to become pregnant, see your health care provider for a preconception visit. What's next?  Visit your health care provider once a year for a well check visit.  Ask your health care provider how often you should have your eyes and teeth checked.  Stay up to date on all vaccines. This information is not intended to replace advice given to you by your health care provider. Make sure you discuss any questions you have with your health care provider. Document Released: 04/21/2001 Document Revised: 11/04/2017 Document Reviewed: 11/04/2017 Elsevier Patient Education  2020 [employer].

## 2018-10-29 LAB — LIPID PANEL
Chol/HDL Ratio: 2.7 ratio (ref 0.0–4.4)
Cholesterol, Total: 196 mg/dL (ref 100–199)
HDL: 73 mg/dL (ref >39)
LDL Calculated: 95 mg/dL (ref 0–99)
Triglycerides: 138 mg/dL (ref 0–149)
VLDL Cholesterol Cal: 28 mg/dL (ref 5–40)

## 2018-10-29 LAB — COMPREHENSIVE METABOLIC PANEL
ALT: 15 IU/L (ref 0–32)
AST: 12 IU/L (ref 0–40)
Albumin/Globulin Ratio: 1.8 (ref 1.2–2.2)
Albumin: 4.2 g/dL (ref 3.8–4.8)
Alkaline Phosphatase: 42 IU/L (ref 39–117)
BUN/Creatinine Ratio: 19 (ref 9–23)
BUN: 15 mg/dL (ref 6–20)
Bilirubin Total: <0.2 mg/dL (ref 0.0–1.2)
CO2: 24 mmol/L (ref 20–29)
Calcium: 9.9 mg/dL (ref 8.7–10.2)
Chloride: 98 mmol/L (ref 96–106)
Creatinine, Ser: 0.79 mg/dL (ref 0.57–1.00)
GFR calc Af Amer: 110 mL/min/{1.73_m2} (ref >59)
GFR calc non Af Amer: 95 mL/min/{1.73_m2} (ref >59)
Globulin, Total: 2.3 g/dL (ref 1.5–4.5)
Glucose: 73 mg/dL (ref 65–99)
Potassium: 3.7 mmol/L (ref 3.5–5.2)
Sodium: 137 mmol/L (ref 134–144)
Total Protein: 6.5 g/dL (ref 6.0–8.5)

## 2018-10-29 LAB — HEMOGLOBIN A1C
Est. average glucose Bld gHb Est-mCnc: 108 mg/dL
Hgb A1c MFr Bld: 5.4 % (ref 4.8–5.6)

## 2018-10-29 LAB — CBC
Hematocrit: 40.2 % (ref 34.0–46.6)
Hemoglobin: 12.9 g/dL (ref 11.1–15.9)
MCH: 28.8 pg (ref 26.6–33.0)
MCHC: 32.1 g/dL (ref 31.5–35.7)
MCV: 90 fL (ref 79–97)
Platelets: 293 10*3/uL (ref 150–450)
RBC: 4.48 x10E6/uL (ref 3.77–5.28)
RDW: 11.9 % (ref 11.7–15.4)
WBC: 8.5 10*3/uL (ref 3.4–10.8)

## 2018-10-29 LAB — THYROID PANEL WITH TSH
Free Thyroxine Index: 3.8 (ref 1.2–4.9)
T3 Uptake Ratio: 28 % (ref 24–39)
T4, Total: 13.4 ug/dL — ABNORMAL HIGH (ref 4.5–12.0)
TSH: 1.69 u[IU]/mL (ref 0.450–4.500)

## 2018-10-29 LAB — B12 AND FOLATE PANEL
Folate: 10.2 ng/mL (ref >3.0)
Vitamin B-12: 275 pg/mL (ref 232–1245)

## 2018-10-29 LAB — FERRITIN: Ferritin: 15 ng/mL (ref 15–150)

## 2018-10-29 LAB — VITAMIN D 25 HYDROXY (VIT D DEFICIENCY, FRACTURES): Vit D, 25-Hydroxy: 28.5 ng/mL — ABNORMAL LOW (ref 30.0–100.0)

## 2018-11-01 LAB — CYTOLOGY - PAP
Adequacy: ABSENT
Diagnosis: NEGATIVE

## 2018-11-09 ENCOUNTER — Other Ambulatory Visit: Payer: Self-pay | Admitting: Family Medicine

## 2018-12-27 ENCOUNTER — Ambulatory Visit: Payer: 59 | Admitting: Neurology

## 2019-01-31 ENCOUNTER — Other Ambulatory Visit: Payer: Self-pay

## 2019-01-31 DIAGNOSIS — Z20822 Contact with and (suspected) exposure to covid-19: Secondary | ICD-10-CM

## 2019-02-02 LAB — NOVEL CORONAVIRUS, NAA: SARS-CoV-2, NAA: NOT DETECTED

## 2019-02-07 ENCOUNTER — Telehealth: Payer: Self-pay

## 2019-02-07 MED ORDER — LEVOTHYROXINE SODIUM 125 MCG PO TABS: ORAL_TABLET | ORAL | 0 refills | Status: DC

## 2019-02-07 NOTE — Telephone Encounter (Signed)
Was being followed by GYN - needs to obtain additional refills from them, or come in for follow up with our clinic. Thanks!

## 2019-02-16 MED ORDER — LEVOTHYROXINE SODIUM 112 MCG PO TABS: ORAL_TABLET | ORAL | 0 refills | Status: DC

## 2019-02-16 MED ORDER — LEVOTHYROXINE SODIUM 125 MCG PO TABS: ORAL_TABLET | ORAL | 0 refills | Status: DC

## 2019-02-16 NOTE — Telephone Encounter (Signed)
Will you start refilling this medication for patient

## 2019-02-16 NOTE — Telephone Encounter (Signed)
Refill sent. Needs follow up 02/22/2019

## 2019-02-16 NOTE — Telephone Encounter (Signed)
Pt has an appt on 02/22/2019 with Raquel Sarna

## 2019-02-16 NOTE — Addendum Note (Signed)
Addended by: Hubbard Hartshorn on: 02/16/2019 03:34 PM   Modules accepted: Orders

## 2019-02-16 NOTE — Telephone Encounter (Addendum)
Pt states her OBGYN is no longer there.  pt has made virtual appt for 02/22/19.  However , is out of medication today.  Requesting Rx sent to pharmacy. Pt states her insurance only allows labs every 6 months.  Pt had labs done 10/2018. Please send: levothyroxine (SYNTHROID) 112 MCG tablet  Pt alternates this dose with the levothyroxine (SYNTHROID) 125 MCG tablet [id number] [address], [address] Phone:  [phone]  Fax:  [phone]

## 2019-02-16 NOTE — Telephone Encounter (Signed)
lvm for pt to schedule appt

## 2019-02-22 ENCOUNTER — Encounter: Payer: Self-pay | Admitting: Family Medicine

## 2019-02-22 ENCOUNTER — Ambulatory Visit (INDEPENDENT_AMBULATORY_CARE_PROVIDER_SITE_OTHER): Payer: 59 | Admitting: Family Medicine

## 2019-02-22 ENCOUNTER — Other Ambulatory Visit: Payer: Self-pay

## 2019-02-22 DIAGNOSIS — E038 Other specified hypothyroidism: Secondary | ICD-10-CM

## 2019-02-22 DIAGNOSIS — D509 Iron deficiency anemia, unspecified: Secondary | ICD-10-CM | POA: Diagnosis not present

## 2019-02-22 DIAGNOSIS — E063 Autoimmune thyroiditis: Secondary | ICD-10-CM

## 2019-02-22 DIAGNOSIS — F411 Generalized anxiety disorder: Secondary | ICD-10-CM | POA: Diagnosis not present

## 2019-02-22 DIAGNOSIS — K5190 Ulcerative colitis, unspecified, without complications: Secondary | ICD-10-CM | POA: Diagnosis not present

## 2019-02-22 NOTE — Progress Notes (Signed)
Name: Melissa Harrison   MRN: [id number]    DOB: November 11, 1980   Date:02/22/2019       Progress Note  Subjective  Chief Complaint  Chief Complaint  Patient presents with  . Medication Refill    I connected with  Magalie Almon Finder on 02/22/19 at  9:00 AM EST by telephone and verified that I am speaking with the correct person using two identifiers.  I discussed the limitations, risks, security and privacy concerns of performing an evaluation and management service by telephone and the availability of in person appointments. Staff also discussed with the patient that there may be a patient responsible charge related to this service. Patient Location: Home Provider Location: Office Additional Individuals present: None  HPI  UC/IDA: Seeing [place] PA-C/Dr. Alice Reichert with [facility]. Most recent visit 02/07/2019.  UC was diagnosed over the summer 2020 when she was referred for diarrhea/IDA/BRBPR  Taking mesalamine with good efficacy.  Denies blood in stool, dark and tarry stools, abdominal pain, N/V, diarrhea/constipation.  She does have some fatigue.  Taking iron supplement.   Hypothyroidism:  She uses eye drops - states that her Hashimoto's causes dry eye - this is working well. Last TSH was 3 months ago and was normal.  She does note that her hair has been thinning and breaking a lot, nails have been more brittle, also endorses some fatigue.  She has been having trouble losing weight recently despite intense exercise.  Her current dose is alternating  121mg and 1225m every other day - has been stable on this for a couple of years.  No diarrhea/constipation, palpitations, heat/cold intolerance.   Anxiety: She takes Ativan PRN when she flies due to panic attacks. She is also taking lexapro and this works very well for her day to day anxiety.  She denies panic attacks.   Patient Active Problem List   Diagnosis Date Noted  . CRP elevated 04/27/2017  . Joint stiffness of knee, unspecified  laterality 04/27/2017  . Cyclic citrullinated peptide (CCP) antibody positive 04/06/2017  . Generalized anxiety disorder 10/26/2016  . BRBPR (bright red blood per rectum) 09/28/2016  . Sleep apnea 12/24/2015  . Hypothyroidism due to Hashimoto's thyroiditis 09/07/2015  . Chronically dry eyes 09/06/2015  . Vitamin D deficiency 08/14/2015  . Anemia, iron deficiency 08/14/2015    Past Surgical History:  Procedure Laterality Date  . CESAREAN SECTION      Family History  Problem Relation Age of Onset  . Cancer Father        LUNG  . Thyroid disease Mother   . Cancer Paternal Grandmother        breast cancer  . Diabetes Sister   . Diabetes Brother   . Heart disease Brother        from his diabetes, heart attack  . Hypertension Brother   . Kidney disease Brother   . Cancer Maternal Aunt        UTERINE  . Eczema Daughter   . Asthma Daughter   . Heart attack Maternal Grandfather   . Cancer Paternal Grandfather        lung    Social History   Socioeconomic History  . Marital status: Married    Spouse name: Not on file  . Number of children: 1  . Years of education: 1680. Highest education level: Not on file  Occupational History  . Occupation: PrSecondary school teacher  Comment: [employer]  . Smoking status: Former  Smoker  . Smokeless tobacco: Never Used  Substance and Sexual Activity  . Alcohol use: Yes    Alcohol/week: 0.0 standard drinks    Comment: an ocassional glass of wine  . Drug use: No  . Sexual activity: Yes    Partners: Male    Birth control/protection: None, Pill  Other Topics Concern  . Not on file  Social History Narrative   Pt lives in 1 story home with her husband and daughter   Has bachelor's degree   Works as Secondary school teacher.    Social Determinants of Health   Financial Resource Strain:   . Difficulty of Paying Living Expenses: Not on file  Food Insecurity:   . Worried About Charity fundraiser in the Last Year: Not on file  . Ran  Out of Food in the Last Year: Not on file  Transportation Needs:   . Lack of Transportation (Medical): Not on file  . Lack of Transportation (Non-Medical): Not on file  Physical Activity:   . Days of Exercise per Week: Not on file  . Minutes of Exercise per Session: Not on file  Stress:   . Feeling of Stress : Not on file  Social Connections:   . Frequency of Communication with Friends and Family: Not on file  . Frequency of Social Gatherings with Friends and Family: Not on file  . Attends Religious Services: Not on file  . Active Member of Clubs or Organizations: Not on file  . Attends Archivist Meetings: Not on file  . Marital Status: Not on file  Intimate Partner Violence:   . Fear of Current or Ex-Partner: Not on file  . Emotionally Abused: Not on file  . Physically Abused: Not on file  . Sexually Abused: Not on file     Current Outpatient Medications:  .  desogestrel-ethinyl estradiol (VIORELE) 0.15-0.02/0.01 MG (21/5) tablet, TAKE 1 TABLET DAILY, SKIP  PLACEBO TABLETS, Disp: 28 tablet, Rfl: 11 .  escitalopram (LEXAPRO) 10 MG tablet, Take 2 tablets daily, Disp: 60 tablet, Rfl: 11 .  hyoscyamine (LEVSIN, ANASPAZ) 0.125 MG tablet, Take 1 tablet (0.125 mg total) by mouth every 6 (six) hours as needed., Disp: 30 tablet, Rfl: 0 .  levothyroxine (SYNTHROID) 112 MCG tablet, TAKE 1 TABLET(112 MCG) BY MOUTH EVERY OTHER DAY, Disp: 45 tablet, Rfl: 0 .  levothyroxine (SYNTHROID) 125 MCG tablet, TAKE 1 TABLET BY MOUTH EVERY OTHER DAY. ALTERNATE WITH 112MCG DOSE, Disp: 15 tablet, Rfl: 0 .  Lifitegrast (XIIDRA) 5 % SOLN, Apply to eye., Disp: , Rfl:  .  amoxicillin-clavulanate (AUGMENTIN) 875-125 MG tablet, Take 1 tablet by mouth 2 (two) times daily. (Patient not taking: Reported on 10/28/2018), Disp: 20 tablet, Rfl: 0 .  Flibanserin (ADDYI) 100 MG TABS, Take 1 tablet by mouth at bedtime. (Patient not taking: Reported on 09/02/2018), Disp: 30 tablet, Rfl: 6 .  LORazepam (ATIVAN) 0.5 MG  tablet, Take 0.5-1 tablets (0.25-0.5 mg total) by mouth 2 (two) times daily as needed for anxiety. No alcohol (Patient not taking: Reported on 10/28/2018), Disp: 8 tablet, Rfl: 0  No Known Allergies  I personally reviewed active problem list, medication list, allergies, notes from last encounter, lab results with the patient/caregiver today.   ROS Constitutional: Negative for fever or weight change.  Respiratory: Negative for cough and shortness of breath.   Cardiovascular: Negative for chest pain or palpitations.  Gastrointestinal: Negative for abdominal pain, no bowel changes.  Musculoskeletal: Negative for gait problem or joint swelling.  Skin:  Negative for rash.  Neurological: Negative for dizziness or headache.  No other specific complaints in a complete review of systems (except as listed in HPI above).  Objective  Virtual encounter, vitals not obtained.  There is no height or weight on file to calculate BMI.  Physical Exam Constitutional: Patient appears well-developed and well-nourished. No distress.  HENT: Head: Normocephalic and atraumatic.  Neck: Normal range of motion. Pulmonary/Chest: Effort normal. No respiratory distress. Speaking in complete sentences Neurological: Pt is alert and oriented to person, place, and time. Coordination, speech and gait are normal.  Psychiatric: Patient has a normal mood and affect. behavior is normal. Judgment and thought content normal.  No results found for this or any previous visit (from the past 72 hour(s)).  PHQ2/9: Depression screen [facility] 2/9 02/22/2019 09/02/2018 04/27/2018 04/02/2017 10/26/2016  Decreased Interest 0 0 0 0 0  Down, Depressed, Hopeless 0 0 0 0 0  PHQ - 2 Score 0 0 0 0 0  Altered sleeping 0 0 0 - -  Tired, decreased energy 0 0 0 - -  Change in appetite 0 0 0 - -  Feeling bad or failure about yourself  0 0 0 - -  Trouble concentrating 0 0 0 - -  Moving slowly or fidgety/restless 0 0 0 - -  Suicidal thoughts 0 0 0 -  -  PHQ-9 Score 0 0 0 - -  Difficult doing work/chores Not difficult at all Not difficult at all Not difficult at all - -   PHQ-2/9 Result is negative.    Fall Risk: Fall Risk  09/02/2018 04/27/2018 04/15/2017 04/02/2017 10/26/2016  Falls in the past year? 0 0 No No No  Number falls in past yr: 0 0 - - -  Injury with Fall? 0 0 - - -     Assessment & Plan  1. Hypothyroidism due to Hashimoto's thyroiditis - We will recheck as she is symptomatic today; if no abnormalities, will recommend biotin for hair/skin/nails.   - Thyroid Panel With TSH  2. Iron deficiency anemia, unspecified iron deficiency anemia type - Taking supplement about every other day. - Iron, TIBC and Ferritin Panel - CBC with Differential/Platelet  3. Generalized anxiety disorder - Doing really well on Lexapro; keeps about 8-10 ativan PRN for the year for plane rides.  4. Ulcerative colitis without complications, unspecified location [facility]) - Doing much better since starting lialda.  No concerns at this time.   I discussed the assessment and treatment plan with the patient. The patient was provided an opportunity to ask questions and all were answered. The patient agreed with the plan and demonstrated an understanding of the instructions.   The patient was advised to call back or seek an in-person evaluation if the symptoms worsen or if the condition fails to improve as anticipated.  I provided 16 minutes of non-face-to-face time during this encounter.  Hubbard Hartshorn, FNP

## 2019-02-25 LAB — CBC WITH DIFFERENTIAL/PLATELET
Absolute Monocytes: 491 cells/uL (ref 200–950)
Basophils Absolute: 59 cells/uL (ref 0–200)
Basophils Relative: 1.1 %
Eosinophils Absolute: 610 cells/uL — ABNORMAL HIGH (ref 15–500)
Eosinophils Relative: 11.3 %
HCT: 38.7 % (ref 35.0–45.0)
Hemoglobin: 12.8 g/dL (ref 11.7–15.5)
Lymphs Abs: 913 cells/uL (ref 850–3900)
MCH: 29.8 pg (ref 27.0–33.0)
MCHC: 33.1 g/dL (ref 32.0–36.0)
MCV: 90.2 fL (ref 80.0–100.0)
MPV: 9.9 fL (ref 7.5–12.5)
Monocytes Relative: 9.1 %
Neutro Abs: 3326 cells/uL (ref 1500–7800)
Neutrophils Relative %: 61.6 %
Platelets: 306 10*3/uL (ref 140–400)
RBC: 4.29 10*6/uL (ref 3.80–5.10)
RDW: 11.3 % (ref 11.0–15.0)
Total Lymphocyte: 16.9 %
WBC: 5.4 10*3/uL (ref 3.8–10.8)

## 2019-02-25 LAB — IRON,TIBC AND FERRITIN PANEL
%SAT: 17 % (calc) (ref 16–45)
Ferritin: 11 ng/mL — ABNORMAL LOW (ref 16–154)
Iron: 69 ug/dL (ref 40–190)
TIBC: 395 mcg/dL (calc) (ref 250–450)

## 2019-02-25 LAB — THYROID PANEL WITH TSH
Free Thyroxine Index: 3.4 (ref 1.4–3.8)
T3 Uptake: 22 % (ref 22–35)
T4, Total: 15.5 ug/dL — ABNORMAL HIGH (ref 5.1–11.9)
TSH: 1.69 mIU/L

## 2019-02-28 ENCOUNTER — Encounter: Payer: Self-pay | Admitting: Family Medicine

## 2019-03-23 ENCOUNTER — Telehealth: Payer: Self-pay

## 2019-03-23 ENCOUNTER — Encounter: Payer: Self-pay | Admitting: Family Medicine

## 2019-03-23 ENCOUNTER — Other Ambulatory Visit: Payer: Self-pay | Admitting: Family Medicine

## 2019-03-23 NOTE — Telephone Encounter (Signed)
Pt needs a refill on birth control. Only 1 pill left and heading out of town on tomo. Melody pt hasn't est care with any other providers yet

## 2019-03-23 NOTE — Telephone Encounter (Signed)
desogestrel-ethinyl estradiol (VIORELE) 0.15-0.02/0.01 MG (21/5) tablet     Patient requesting a 30 day supply.    Pharmacy:  [facility] #[id number] [address], [place] Phone:  [phone]  Fax:  [phone]

## 2019-03-24 MED ORDER — DESOGESTREL-ETHINYL ESTRADIOL 0.15-0.02/0.01 MG (21/5) PO TABS: ORAL_TABLET | ORAL | 0 refills | Status: DC

## 2019-03-24 NOTE — Telephone Encounter (Signed)
Called and spoke with patient.  PCP sent prescription in and patient is planning on continuing care through them and declined additional refills through [place].

## 2019-03-27 MED ORDER — DESOGESTREL-ETHINYL ESTRADIOL 0.15-0.02/0.01 MG (21/5) PO TABS: ORAL_TABLET | ORAL | 0 refills | Status: DC

## 2019-03-27 NOTE — Telephone Encounter (Signed)
Order sent to [facility] for refill 

## 2019-04-03 ENCOUNTER — Ambulatory Visit (INDEPENDENT_AMBULATORY_CARE_PROVIDER_SITE_OTHER): Payer: 59 | Admitting: Family Medicine

## 2019-04-03 ENCOUNTER — Other Ambulatory Visit: Payer: Self-pay

## 2019-04-03 ENCOUNTER — Encounter: Payer: Self-pay | Admitting: Family Medicine

## 2019-04-03 DIAGNOSIS — I739 Peripheral vascular disease, unspecified: Secondary | ICD-10-CM | POA: Insufficient documentation

## 2019-04-03 DIAGNOSIS — Z3041 Encounter for surveillance of contraceptive pills: Secondary | ICD-10-CM

## 2019-04-03 MED ORDER — DESOGESTREL-ETHINYL ESTRADIOL 0.15-0.02/0.01 MG (21/5) PO TABS: ORAL_TABLET | ORAL | 3 refills | Status: DC

## 2019-04-03 NOTE — Progress Notes (Signed)
Name: Melissa Harrison   MRN: [id number]    DOB: 21-May-1980   Date:04/03/2019       Progress Note  Subjective  Chief Complaint  Chief Complaint  Patient presents with  . Medication Refill    I connected with  Melissa Harrison  on 04/03/19 at  2:00 PM EST by a video enabled telemedicine application and verified that I am speaking with the correct person using two identifiers.  I discussed the limitations of evaluation and management by telemedicine and the availability of in person appointments. The patient expressed understanding and agreed to proceed. Staff also discussed with the patient that there may be a patient responsible charge related to this service. Patient Location: Home Provider Location: Office Additional Individuals present: None  HPI  Pt presents to discuss OCP management - her GYN provider has left her practice, and she would like for me to take over this Rx.  She has been taking the same OCP dosing for many years and has not had any issues.  She uses the medication to avoid her period - takes 90 days in a row, then has menses.  Denies painful or heavy periods, no history blood clots, has remote history of migraines - none in several years, former smoker (quit in 2013).  Discussed risk of blood clots, signs and symptoms of blood clots - she is low risk aside from age.  Patient Active Problem List   Diagnosis Date Noted  . Peripheral vasodilation ([place]) 04/03/2019  . Ulcerative colitis ([place]) 02/22/2019  . CRP elevated 04/27/2017  . Joint stiffness of knee, unspecified laterality 04/27/2017  . Cyclic citrullinated peptide (CCP) antibody positive 04/06/2017  . Generalized anxiety disorder 10/26/2016  . Sleep apnea 12/24/2015  . Hypothyroidism due to Hashimoto's thyroiditis 09/07/2015  . Chronically dry eyes 09/06/2015  . Vitamin D deficiency 08/14/2015  . Anemia, iron deficiency 08/14/2015    Past Surgical History:  Procedure Laterality Date  . CESAREAN  SECTION      Family History  Problem Relation Age of Onset  . Cancer Father        LUNG  . Thyroid disease Mother   . Cancer Paternal Grandmother        breast cancer  . Diabetes Sister   . Diabetes Brother   . Heart disease Brother        from his diabetes, heart attack  . Hypertension Brother   . Kidney disease Brother   . Cancer Maternal Aunt        UTERINE  . Eczema Daughter   . Asthma Daughter   . Heart attack Maternal Grandfather   . Cancer Paternal Grandfather        lung    Social History   Socioeconomic History  . Marital status: Married    Spouse name: Not on file  . Number of children: 1  . Years of education: 80  . Highest education level: Not on file  Occupational History  . Occupation: Secondary school teacher    Comment: [place] [employer]  . Smoking status: Former Research scientist (life sciences)  . Smokeless tobacco: Never Used  Substance and Sexual Activity  . Alcohol use: Yes    Alcohol/week: 0.0 standard drinks    Comment: an ocassional glass of wine  . Drug use: No  . Sexual activity: Yes    Partners: Male    Birth control/protection: None, Pill  Other Topics Concern  . Not on file  Social History Narrative   Pt  lives in 1 story home with her husband and daughter   Has bachelor's degree   Works as Secondary school teacher.    Social Determinants of Health   Financial Resource Strain:   . Difficulty of Paying Living Expenses: Not on file  Food Insecurity:   . Worried About Charity fundraiser in the Last Year: Not on file  . Ran Out of Food in the Last Year: Not on file  Transportation Needs:   . Lack of Transportation (Medical): Not on file  . Lack of Transportation (Non-Medical): Not on file  Physical Activity:   . Days of Exercise per Week: Not on file  . Minutes of Exercise per Session: Not on file  Stress:   . Feeling of Stress : Not on file  Social Connections:   . Frequency of Communication with Friends and Family: Not on file  . Frequency of Social  Gatherings with Friends and Family: Not on file  . Attends Religious Services: Not on file  . Active Member of Clubs or Organizations: Not on file  . Attends Archivist Meetings: Not on file  . Marital Status: Not on file  Intimate Partner Violence:   . Fear of Current or Ex-Partner: Not on file  . Emotionally Abused: Not on file  . Physically Abused: Not on file  . Sexually Abused: Not on file     Current Outpatient Medications:  .  cyanocobalamin (,VITAMIN B-12,) 1000 MCG/ML injection, , Disp: , Rfl:  .  desogestrel-ethinyl estradiol (VIORELE) 0.15-0.02/0.01 MG (21/5) tablet, TAKE 1 TABLET DAILY, SKIP  PLACEBO TABLETS, Disp: 28 tablet, Rfl: 0 .  escitalopram (LEXAPRO) 10 MG tablet, Take 2 tablets daily, Disp: 60 tablet, Rfl: 11 .  levothyroxine (SYNTHROID) 112 MCG tablet, TAKE 1 TABLET(112 MCG) BY MOUTH EVERY OTHER DAY, Disp: 45 tablet, Rfl: 0 .  levothyroxine (SYNTHROID) 125 MCG tablet, TAKE 1 TABLET BY MOUTH EVERY OTHER DAY. ALTERNATE WITH 112MCG DOSE, Disp: 15 tablet, Rfl: 0 .  LORazepam (ATIVAN) 0.5 MG tablet, Take 0.5-1 tablets (0.25-0.5 mg total) by mouth 2 (two) times daily as needed for anxiety. No alcohol, Disp: 8 tablet, Rfl: 0 .  mesalamine (LIALDA) 1.2 g EC tablet, Take 4 tablets by mouth daily., Disp: , Rfl:  .  XIIDRA 5 % SOLN, INT 1 GTT IN OU BID, Disp: , Rfl:   No Known Allergies  I personally reviewed active problem list, medication list, allergies, notes from last encounter, lab results with the patient/caregiver today.   ROS  Constitutional: Negative for fever or weight change.  Respiratory: Negative for cough and shortness of breath.   Cardiovascular: Negative for chest pain or palpitations.  Gastrointestinal: Negative for abdominal pain, no bowel changes.  Musculoskeletal: Negative for gait problem or joint swelling.  Skin: Negative for rash.  Neurological: Negative for dizziness or headache.  No other specific complaints in a complete review of  systems (except as listed in HPI above)  Objective  Virtual encounter, vitals not obtained.  There is no height or weight on file to calculate BMI.  Physical Exam  Constitutional: Patient appears well-developed and well-nourished. No distress.  HENT: Head: Normocephalic and atraumatic.  Neck: Normal range of motion. Pulmonary/Chest: Effort normal. No respiratory distress. Speaking in complete sentences Neurological: Pt is alert and oriented to person, place, and time. Coordination, speech are normal.  Psychiatric: Patient has a normal mood and affect. behavior is normal. Judgment and thought content normal.  No results found for this or any previous  visit (from the past 72 hour(s)).  PHQ2/9: Depression screen [facility] 2/9 04/03/2019 02/22/2019 09/02/2018 04/27/2018 04/02/2017  Decreased Interest 0 0 0 0 0  Down, Depressed, Hopeless 0 0 0 0 0  PHQ - 2 Score 0 0 0 0 0  Altered sleeping 0 0 0 0 -  Tired, decreased energy 0 0 0 0 -  Change in appetite 0 0 0 0 -  Feeling bad or failure about yourself  0 0 0 0 -  Trouble concentrating 0 0 0 0 -  Moving slowly or fidgety/restless 0 0 0 0 -  Suicidal thoughts 0 0 0 0 -  PHQ-9 Score 0 0 0 0 -  Difficult doing work/chores Not difficult at all Not difficult at all Not difficult at all Not difficult at all -   PHQ-2/9 Result is negative.    Fall Risk: Fall Risk  04/03/2019 09/02/2018 04/27/2018 04/15/2017 04/02/2017  Falls in the past year? 0 0 0 No No  Number falls in past yr: 0 0 0 - -  Injury with Fall? - 0 0 - -  Follow up Falls evaluation completed - - - -    Assessment & Plan  1. Encounter for surveillance of contraceptive pills - desogestrel-ethinyl estradiol (VIORELE) 0.15-0.02/0.01 MG (21/5) tablet; TAKE 1 TABLET DAILY, SKIP  PLACEBO TABLETS  Dispense: 3 Package; Refill: 3  I discussed the assessment and treatment plan with the patient. The patient was provided an opportunity to ask questions and all were answered. The patient agreed  with the plan and demonstrated an understanding of the instructions.  The patient was advised to call back or seek an in-person evaluation if the symptoms worsen or if the condition fails to improve as anticipated.  I provided 10 minutes of non-face-to-face time during this encounter.

## 2019-04-08 ENCOUNTER — Other Ambulatory Visit: Payer: Self-pay | Admitting: Family Medicine

## 2019-04-24 ENCOUNTER — Other Ambulatory Visit: Payer: 59

## 2019-05-08 ENCOUNTER — Other Ambulatory Visit: Payer: Self-pay

## 2019-05-08 ENCOUNTER — Other Ambulatory Visit: Payer: Self-pay | Admitting: Family Medicine

## 2019-05-08 MED ORDER — ESCITALOPRAM OXALATE 10 MG PO TABS: ORAL_TABLET | ORAL | 0 refills | Status: DC

## 2019-05-16 ENCOUNTER — Other Ambulatory Visit: Payer: Self-pay

## 2019-05-16 ENCOUNTER — Telehealth: Payer: Self-pay | Admitting: Family Medicine

## 2019-05-16 NOTE — Telephone Encounter (Signed)
Requested medication (s) are due for refill today: yes  Requested medication (s) are on the active medication list: yes  Last refill:  05/08/19 per neurology Dr Karel Jarvis  Future visit scheduled: no  Notes to clinic: historical provider   Requested Prescriptions  Pending Prescriptions Disp Refills   escitalopram (LEXAPRO) 10 MG tablet 60 tablet 0    Sig: Take 2 tablets daily      Psychiatry:  Antidepressants - SSRI Failed - 05/16/2019 11:16 AM      Failed - Valid encounter within last 6 months    Recent Outpatient Visits           1 month ago Encounter for surveillance of contraceptive pills   [facility] [place], Gerome Apley, FNP   2 months ago Hypothyroidism due to Hashimoto's thyroiditis   [facility] Doren Custard, FNP   8 months ago Ear pain, right   [facility] Cheryle Horsfall, NP   1 year ago Diarrhea, unspecified type   [facility] [place], Janit Bern, MD   2 years ago Visual changes   [facility] Lada, Janit Bern, MD

## 2019-05-16 NOTE — Telephone Encounter (Signed)
Medication Refill - Medication: lexapro  Has the patient contacted their pharmacy? Yes.   (Agent: If no, request that the patient contact the pharmacy for the refill.) (Agent: If yes, when and what did the pharmacy advise?)  Preferred Pharmacy (with phone number or street name):  [facility] #[id number] [address], [place] - [address] AT [facility] [address]  [address] [place] [address]  Phone: [phone] Fax: [phone]  Not a 24 hour pharmacy; exact hours not known.     Agent: Please be advised that RX refills may take up to 3 business days. We ask that you follow-up with your pharmacy.

## 2019-05-18 NOTE — Telephone Encounter (Signed)
Pt called and is requesting to have the non generic version of this medication. Please advise.

## 2019-05-19 NOTE — Telephone Encounter (Signed)
Last seen 02/22/19 can she get Lexapro name brand only

## 2019-05-22 MED ORDER — ESCITALOPRAM OXALATE 10 MG PO TABS: 10.0000 mg | ORAL_TABLET | Freq: Every day | ORAL | 1 refills | Status: DC

## 2019-05-22 NOTE — Addendum Note (Signed)
Addended by: Danelle Berry on: 05/22/2019 05:19 PM   Modules accepted: Orders

## 2019-05-22 NOTE — Telephone Encounter (Signed)
Sent in brand name

## 2019-05-22 NOTE — Telephone Encounter (Signed)
Band name sent in  Pt needs to designate PCP for further refills. Delsa Grana, PA-C

## 2019-05-23 ENCOUNTER — Encounter: Payer: Self-pay | Admitting: Family Medicine

## 2019-05-25 NOTE — Progress Notes (Signed)
Patient ID: Melissa Harrison, female    DOB: 02/25/1981, 39 y.o.   MRN: [id number]  PCP: Patient, No Pcp Per  Chief Complaint  Patient presents with  . Hypothyroidism  . Anxiety    needs brand name lexapro, escitolapram does not work well for her    Subjective:   Melissa Harrison is a 39 y.o. female, presents to clinic with CC of the following:  Chief Complaint  Patient presents with  . Hypothyroidism  . Anxiety    needs brand name lexapro, escitolapram does not work well for her    HPI:  Patient is a 39 year old female with a history of ulcerative colitis and sees gastroenterology, last visit 02/07/2019 who presents today to have her thyroid status checked  Hypothyroidism:   Her last thyroid panel was checked in 02/24/2019, T4 was a slightly inceased at 15.5,  T3 and TSH normal, continue current regimen was rec'ed by Raelyn Ensign at that time.  She uses eye drops - states that her Hashimoto's causes dry eye - this is working well.  Last visit with Raquel Sarna she noted that her hair has been thinning and breaking a lot, nails have been more brittle, and today went as she did not have concerns with her hair or nails.  Did note some mild increased fatigue.  Also noted more grumpy and irritable, and she noted was like that when her thyroid was off previously. She has been having trouble losing weight despite intense exercise. Works out a lot.  Hard to say any constipation or diarrhea with her ulcerative colitis, noted occasional palpitations, not daily and not very problematic, no chest pains, no heat or cold intolerance She would like to get her levels checked again today.  Her current dose is alternating  137mcg and 145mcg every other day - has been stable on this for a couple of years.     GAD Patient managed with Lexapro, and notes the generic Escitalopram which she is still taking does not seem to be working as well for her.  She has a friend who is a Designer, jewellery  on Lexapro, and recommended she get the brand name not the generic as that may be an issue.  My colleague Delsa Grana had recently refilled this for her with the request for brand name only on 05/22/2019.  She has not yet picked that up at the pharmacy. She does note the Lexapro is helpful, and will continue with the change to the brand name to see if that has any effect.  She has no other concerns today when asked.   Patient Active Problem List   Diagnosis Date Noted  . Peripheral vasodilation ([place]) 04/03/2019  . Ulcerative colitis ([place]) 02/22/2019  . CRP elevated 04/27/2017  . Joint stiffness of knee, unspecified laterality 04/27/2017  . Cyclic citrullinated peptide (CCP) antibody positive 04/06/2017  . Generalized anxiety disorder 10/26/2016  . Sleep apnea 12/24/2015  . Hypothyroidism due to Hashimoto's thyroiditis 09/07/2015  . Chronically dry eyes 09/06/2015  . Vitamin D deficiency 08/14/2015  . Anemia, iron deficiency 08/14/2015      Current Outpatient Medications:  .  cyanocobalamin (,VITAMIN B-12,) 1000 MCG/ML injection, , Disp: , Rfl:  .  desogestrel-ethinyl estradiol (VIORELE) 0.15-0.02/0.01 MG (21/5) tablet, TAKE 1 TABLET DAILY, SKIP  PLACEBO TABLETS, Disp: 3 Package, Rfl: 3 .  escitalopram (LEXAPRO) 10 MG tablet, Take 1 tablet (10 mg total) by mouth daily., Disp: 90 tablet, Rfl: 1 .  levothyroxine (SYNTHROID)  112 MCG tablet, TAKE 1 TABLET(112 MCG) BY MOUTH EVERY OTHER DAY, Disp: 45 tablet, Rfl: 0 .  levothyroxine (SYNTHROID) 125 MCG tablet, TAKE 1 TABLET BY MOUTH EVERY OTHER DAY, ALTERNATE WITH 112 MCG DOSE, Disp: 45 tablet, Rfl: 1 .  mesalamine (LIALDA) 1.2 g EC tablet, Take 4 tablets by mouth daily., Disp: , Rfl:  .  XIIDRA 5 % SOLN, INT 1 GTT IN OU BID, Disp: , Rfl:  .  LORazepam (ATIVAN) 0.5 MG tablet, Take 0.5-1 tablets (0.25-0.5 mg total) by mouth 2 (two) times daily as needed for anxiety. No alcohol (Patient not taking: Reported on 05/26/2019), Disp: 8 tablet, Rfl: 0    No Known Allergies   Past Surgical History:  Procedure Laterality Date  . CESAREAN SECTION       Family History  Problem Relation Age of Onset  . Cancer Father        LUNG  . Thyroid disease Mother   . Cancer Paternal Grandmother        breast cancer  . Diabetes Sister   . Diabetes Brother   . Heart disease Brother        from his diabetes, heart attack  . Hypertension Brother   . Kidney disease Brother   . Cancer Maternal Aunt        UTERINE  . Eczema Daughter   . Asthma Daughter   . Heart attack Maternal Grandfather   . Cancer Paternal Grandfather        lung     Social History   Tobacco Use  . Smoking status: Former Games developer  . Smokeless tobacco: Never Used  Substance Use Topics  . Alcohol use: Yes    Alcohol/week: 0.0 standard drinks    Comment: an ocassional glass of wine    With staff assistance, above reviewed with the patient today.  ROS: As per HPI, otherwise no specific complaints on a limited and focused system review   No results found for this or any previous visit (from the past 72 hour(s)).   PHQ2/9: Depression screen [facility] 2/9 05/26/2019 04/03/2019 02/22/2019 09/02/2018 04/27/2018  Decreased Interest 0 0 0 0 0  Down, Depressed, Hopeless 0 0 0 0 0  PHQ - 2 Score 0 0 0 0 0  Altered sleeping 0 0 0 0 0  Tired, decreased energy 3 0 0 0 0  Change in appetite 0 0 0 0 0  Feeling bad or failure about yourself  0 0 0 0 0  Trouble concentrating 0 0 0 0 0  Moving slowly or fidgety/restless 0 0 0 0 0  Suicidal thoughts 0 0 0 0 0  PHQ-9 Score 3 0 0 0 0  Difficult doing work/chores Very difficult Not difficult at all Not difficult at all Not difficult at all Not difficult at all   PHQ-2/9 Result is neg  GAD 7 : Generalized Anxiety Score 05/26/2019  Nervous, Anxious, on Edge 2  Control/stop worrying 0  Worry too much - different things 0  Trouble relaxing 0  Restless 0  Easily annoyed or irritable 3  Afraid - awful might happen 0  Total GAD 7 Score  5  Anxiety Difficulty Very difficult   Result reviewed - mild sx's, on Lexapro as above  Fall Risk: Fall Risk  05/26/2019 04/03/2019 09/02/2018 04/27/2018 04/15/2017  Falls in the past year? 0 0 0 0 No  Number falls in past yr: 0 0 0 0 -  Injury with Fall? 0 - 0 0 -  Follow up - Falls evaluation completed - - -      Objective:   Vitals:   05/26/19 0958  BP: 118/80  Pulse: 97  Resp: 16  Temp: 97.8 F (36.6 C)  TempSrc: Temporal  SpO2: 97%  Weight: 181 lb 3.2 oz (82.2 kg)  Height: 5\' 8"  (1.727 m)    Body mass index is 27.55 kg/m.  Physical Exam   NAD, masked HEENT - [place]/AT, sclera anicteric, PERRL, EOMI, conj - non-inj'ed, pharynx clear Neck - supple, no adenopathy, no TM,  Car - RRR without m/g/r Pulm- RR and effort normal at rest, CTA without wheeze or rales Abd - soft, NT,  Ext - no obvious LE edema, had boots on and denied any increased swelling of her ankles, no active joints, interosseous muscle strength of the hands was adequate bilaterally. Neuro/psychiatric - affect was not flat, appropriate with conversation  Alert and oriented  Grossly non-focal, DTRs 2+ and equal in the patella,  Speech and gait are normal   Results for orders placed or performed in visit on 02/22/19  Thyroid Panel With TSH  Result Value Ref Range   T3 Uptake 22 22 - 35 %   T4, Total 15.5 (H) 5.1 - 11.9 mcg/dL   Free Thyroxine Index 3.4 1.4 - 3.8   TSH 1.69 mIU/L  Iron, TIBC and Ferritin Panel  Result Value Ref Range   Iron 69 40 - 190 mcg/dL   TIBC 02/24/19 696 - 295 mcg/dL (calc)   %SAT 17 16 - 45 % (calc)   Ferritin 11 (L) 16 - 154 ng/mL  CBC with Differential/Platelet  Result Value Ref Range   WBC 5.4 3.8 - 10.8 Thousand/uL   RBC 4.29 3.80 - 5.10 Million/uL   Hemoglobin 12.8 11.7 - 15.5 g/dL   HCT 284 13.2 - 44.0 %   MCV 90.2 80.0 - 100.0 fL   MCH 29.8 27.0 - 33.0 pg   MCHC 33.1 32.0 - 36.0 g/dL   RDW 10.2 72.5 - 36.6 %   Platelets 306 140 - 400 Thousand/uL   MPV 9.9 7.5 - 12.5  fL   Neutro Abs 3,326 1,500 - 7,800 cells/uL   Lymphs Abs 913 850 - 3,900 cells/uL   Absolute Monocytes 491 200 - 950 cells/uL   Eosinophils Absolute 610 (H) 15 - 500 cells/uL   Basophils Absolute 59 0 - 200 cells/uL   Neutrophils Relative % 61.6 %   Total Lymphocyte 16.9 %   Monocytes Relative 9.1 %   Eosinophils Relative 11.3 %   Basophils Relative 1.1 %       Assessment & Plan:    1. Hypothyroidism due to Hashimoto's thyroiditis Patient has been stable on her thyroid supplementation, alternating the doses of the supplement as noted above and has been doing that for years.  Her last T4 was slightly elevated, with her TSH normal.  Clinically appears euthyroid today, and will recheck labs as well.  We will check a free T4 and a TSH, and do feel the free T4 will be helpful given the slight elevation on last check. Her ferritin was slightly low on last lab check, and she was not anemic.  Recommendations were as noted by 44.0, and will not rush to recheck that today, although over time, pending her clinical status may recheck a CBC with a ferritin if more fatigued.  She remains very active presently. - TSH + free T4  2. Generalized anxiety disorder Has been controlled with the Lexapro,  and she requested brand name only, and that was ordered by my colleague.  We will see how she does with this.    Await lab results presently.      Jamelle Haring, MD 05/26/19 10:11 AM

## 2019-05-26 ENCOUNTER — Other Ambulatory Visit: Payer: Self-pay

## 2019-05-26 ENCOUNTER — Ambulatory Visit: Payer: 59 | Admitting: Internal Medicine

## 2019-05-26 ENCOUNTER — Encounter: Payer: Self-pay | Admitting: Internal Medicine

## 2019-05-26 VITALS — BP 118/80 | HR 97 | Temp 97.8°F | Resp 16 | Ht 68.0 in | Wt 181.2 lb

## 2019-05-26 DIAGNOSIS — F411 Generalized anxiety disorder: Secondary | ICD-10-CM

## 2019-05-26 DIAGNOSIS — E038 Other specified hypothyroidism: Secondary | ICD-10-CM | POA: Diagnosis not present

## 2019-05-26 DIAGNOSIS — E063 Autoimmune thyroiditis: Secondary | ICD-10-CM

## 2019-05-26 LAB — TSH+FREE T4: TSH W/REFLEX TO FT4: 1.48 mIU/L

## 2019-06-21 ENCOUNTER — Encounter: Payer: Self-pay | Admitting: Internal Medicine

## 2019-06-21 ENCOUNTER — Other Ambulatory Visit: Payer: Self-pay

## 2019-06-21 DIAGNOSIS — Z3041 Encounter for surveillance of contraceptive pills: Secondary | ICD-10-CM

## 2019-06-21 MED ORDER — DESOGESTREL-ETHINYL ESTRADIOL 0.15-0.02/0.01 MG (21/5) PO TABS: ORAL_TABLET | ORAL | 3 refills | Status: DC

## 2019-06-22 ENCOUNTER — Telehealth: Payer: Self-pay | Admitting: General Practice

## 2019-06-22 NOTE — Telephone Encounter (Signed)
Pt went to get Rx for desogestrel-ethinyl estradiol (VIORELE) 0.15-0.02/0.01 MG (21/5) tablet  But her insurance will not cover it and advised her that she could not be covered for this medication for another two weeks and that it was too early /Pt would like to speak with nurse about options because she only has one tab left/ please advise asap

## 2019-06-23 ENCOUNTER — Other Ambulatory Visit: Payer: Self-pay | Admitting: Internal Medicine

## 2019-06-23 DIAGNOSIS — Z3041 Encounter for surveillance of contraceptive pills: Secondary | ICD-10-CM

## 2019-06-23 MED ORDER — DESOGESTREL-ETHINYL ESTRADIOL 0.15-0.02/0.01 MG (21/5) PO TABS: ORAL_TABLET | ORAL | 3 refills | Status: DC

## 2019-06-27 ENCOUNTER — Other Ambulatory Visit: Payer: Self-pay | Admitting: Internal Medicine

## 2019-06-27 ENCOUNTER — Encounter: Payer: Self-pay | Admitting: Internal Medicine

## 2019-06-27 ENCOUNTER — Other Ambulatory Visit: Payer: Self-pay

## 2019-06-27 DIAGNOSIS — F411 Generalized anxiety disorder: Secondary | ICD-10-CM

## 2019-06-27 MED ORDER — ESCITALOPRAM OXALATE 10 MG PO TABS: 20.0000 mg | ORAL_TABLET | Freq: Every day | ORAL | 1 refills | Status: DC

## 2019-06-27 NOTE — Progress Notes (Signed)
Patient's prescription for Lexapro-two 10 mg tablets daily was refilled, and she takes brand-name only.

## 2019-08-08 ENCOUNTER — Encounter: Payer: Self-pay | Admitting: Internal Medicine

## 2019-08-08 ENCOUNTER — Other Ambulatory Visit: Payer: Self-pay

## 2019-08-08 DIAGNOSIS — Z3041 Encounter for surveillance of contraceptive pills: Secondary | ICD-10-CM

## 2019-08-08 MED ORDER — DESOGESTREL-ETHINYL ESTRADIOL 0.15-0.02/0.01 MG (21/5) PO TABS: ORAL_TABLET | ORAL | 3 refills | Status: DC

## 2019-08-16 ENCOUNTER — Other Ambulatory Visit: Payer: Self-pay

## 2019-08-16 MED ORDER — LEVOTHYROXINE SODIUM 125 MCG PO TABS: ORAL_TABLET | ORAL | 1 refills | Status: DC

## 2019-08-16 MED ORDER — LEVOTHYROXINE SODIUM 112 MCG PO TABS: ORAL_TABLET | ORAL | 1 refills | Status: DC

## 2019-08-25 ENCOUNTER — Other Ambulatory Visit
Admission: RE | Admit: 2019-08-25 | Discharge: 2019-08-25 | Disposition: A | Discharge status: Home / Self Care | Payer: 59 | Source: Ambulatory Visit | Attending: Gastroenterology | Admitting: Gastroenterology

## 2019-08-25 DIAGNOSIS — R197 Diarrhea, unspecified: Secondary | ICD-10-CM | POA: Diagnosis present

## 2019-08-25 LAB — GASTROINTESTINAL PANEL BY PCR, STOOL (REPLACES STOOL CULTURE)

## 2019-11-07 ENCOUNTER — Encounter: Payer: Self-pay | Admitting: Internal Medicine

## 2019-11-08 ENCOUNTER — Other Ambulatory Visit: Payer: Self-pay | Admitting: Internal Medicine

## 2019-11-08 DIAGNOSIS — E038 Other specified hypothyroidism: Secondary | ICD-10-CM

## 2019-11-08 NOTE — Addendum Note (Signed)
Addended by: Lennie Muckle on: 11/08/2019 02:40 PM   Modules accepted: Orders

## 2019-11-08 NOTE — Progress Notes (Signed)
Labs ordered as she requested having her thyroid checked again, and it has been about 6 months since her last check.  Feel is reasonable.

## 2019-11-09 LAB — TSH+FREE T4: TSH W/REFLEX TO FT4: 0.89 mIU/L

## 2019-12-12 ENCOUNTER — Encounter: Payer: Self-pay | Admitting: Internal Medicine

## 2019-12-19 DIAGNOSIS — K5100 Ulcerative (chronic) pancolitis without complications: Secondary | ICD-10-CM | POA: Diagnosis not present

## 2019-12-25 ENCOUNTER — Other Ambulatory Visit: Payer: Self-pay | Admitting: Internal Medicine

## 2019-12-25 DIAGNOSIS — F411 Generalized anxiety disorder: Secondary | ICD-10-CM

## 2019-12-26 DIAGNOSIS — K5100 Ulcerative (chronic) pancolitis without complications: Secondary | ICD-10-CM | POA: Diagnosis not present

## 2019-12-26 DIAGNOSIS — Z7952 Long term (current) use of systemic steroids: Secondary | ICD-10-CM | POA: Diagnosis not present

## 2019-12-29 DIAGNOSIS — K5100 Ulcerative (chronic) pancolitis without complications: Secondary | ICD-10-CM | POA: Diagnosis not present

## 2020-01-18 ENCOUNTER — Other Ambulatory Visit: Payer: 59 | Attending: Gastroenterology

## 2020-01-22 ENCOUNTER — Ambulatory Visit: Admission: RE | Admit: 2020-01-22 | Payer: 59 | Source: Home / Self Care

## 2020-01-22 ENCOUNTER — Encounter: Admission: RE | Payer: Self-pay | Source: Home / Self Care

## 2020-01-22 SURGERY — COLONOSCOPY WITH PROPOFOL
Anesthesia: General

## 2020-01-23 DIAGNOSIS — Z01818 Encounter for other preprocedural examination: Secondary | ICD-10-CM | POA: Diagnosis not present

## 2020-01-25 ENCOUNTER — Other Ambulatory Visit: Payer: Self-pay | Admitting: Internal Medicine

## 2020-01-26 DIAGNOSIS — K51018 Ulcerative (chronic) pancolitis with other complication: Secondary | ICD-10-CM | POA: Diagnosis not present

## 2020-01-26 DIAGNOSIS — R197 Diarrhea, unspecified: Secondary | ICD-10-CM | POA: Diagnosis not present

## 2020-01-26 DIAGNOSIS — K529 Noninfective gastroenteritis and colitis, unspecified: Secondary | ICD-10-CM | POA: Diagnosis not present

## 2020-01-26 DIAGNOSIS — D649 Anemia, unspecified: Secondary | ICD-10-CM | POA: Diagnosis not present

## 2020-01-26 DIAGNOSIS — K5100 Ulcerative (chronic) pancolitis without complications: Secondary | ICD-10-CM | POA: Diagnosis not present

## 2020-02-06 DIAGNOSIS — K529 Noninfective gastroenteritis and colitis, unspecified: Secondary | ICD-10-CM | POA: Diagnosis not present

## 2020-02-06 DIAGNOSIS — K5100 Ulcerative (chronic) pancolitis without complications: Secondary | ICD-10-CM | POA: Diagnosis not present

## 2020-02-23 ENCOUNTER — Other Ambulatory Visit: Payer: Self-pay | Admitting: Gastroenterology

## 2020-02-23 DIAGNOSIS — K529 Noninfective gastroenteritis and colitis, unspecified: Secondary | ICD-10-CM | POA: Diagnosis not present

## 2020-03-12 ENCOUNTER — Other Ambulatory Visit: Payer: Self-pay

## 2020-03-14 ENCOUNTER — Ambulatory Visit: Payer: Self-pay

## 2020-03-20 DIAGNOSIS — K5010 Crohn's disease of large intestine without complications: Secondary | ICD-10-CM | POA: Diagnosis not present

## 2020-03-20 DIAGNOSIS — K529 Noninfective gastroenteritis and colitis, unspecified: Secondary | ICD-10-CM | POA: Diagnosis not present

## 2020-04-05 ENCOUNTER — Ambulatory Visit: Payer: BC Managed Care – PPO

## 2020-04-05 DIAGNOSIS — K51018 Ulcerative (chronic) pancolitis with other complication: Secondary | ICD-10-CM | POA: Diagnosis not present

## 2020-04-17 DIAGNOSIS — K529 Noninfective gastroenteritis and colitis, unspecified: Secondary | ICD-10-CM | POA: Diagnosis not present

## 2020-04-17 DIAGNOSIS — A498 Other bacterial infections of unspecified site: Secondary | ICD-10-CM | POA: Diagnosis not present

## 2020-04-19 ENCOUNTER — Ambulatory Visit: Payer: BC Managed Care – PPO

## 2020-04-19 DIAGNOSIS — K51018 Ulcerative (chronic) pancolitis with other complication: Secondary | ICD-10-CM | POA: Diagnosis not present

## 2020-04-30 ENCOUNTER — Ambulatory Visit (INDEPENDENT_AMBULATORY_CARE_PROVIDER_SITE_OTHER): Payer: BC Managed Care – PPO | Admitting: Family Medicine

## 2020-04-30 ENCOUNTER — Encounter: Payer: Self-pay | Admitting: Family Medicine

## 2020-04-30 ENCOUNTER — Other Ambulatory Visit: Payer: Self-pay

## 2020-04-30 VITALS — BP 122/80 | HR 87 | Temp 98.4°F | Resp 16 | Ht 68.11 in | Wt 183.8 lb

## 2020-04-30 DIAGNOSIS — Z1159 Encounter for screening for other viral diseases: Secondary | ICD-10-CM

## 2020-04-30 DIAGNOSIS — R519 Headache, unspecified: Secondary | ICD-10-CM | POA: Insufficient documentation

## 2020-04-30 DIAGNOSIS — Z0000 Encounter for general adult medical examination without abnormal findings: Secondary | ICD-10-CM

## 2020-04-30 DIAGNOSIS — R42 Dizziness and giddiness: Secondary | ICD-10-CM

## 2020-04-30 DIAGNOSIS — D509 Iron deficiency anemia, unspecified: Secondary | ICD-10-CM

## 2020-04-30 DIAGNOSIS — E038 Other specified hypothyroidism: Secondary | ICD-10-CM

## 2020-04-30 DIAGNOSIS — K5190 Ulcerative colitis, unspecified, without complications: Secondary | ICD-10-CM

## 2020-04-30 DIAGNOSIS — Z1231 Encounter for screening mammogram for malignant neoplasm of breast: Secondary | ICD-10-CM

## 2020-04-30 DIAGNOSIS — Z114 Encounter for screening for human immunodeficiency virus [HIV]: Secondary | ICD-10-CM | POA: Diagnosis not present

## 2020-04-30 DIAGNOSIS — E063 Autoimmune thyroiditis: Secondary | ICD-10-CM

## 2020-04-30 DIAGNOSIS — G44209 Tension-type headache, unspecified, not intractable: Secondary | ICD-10-CM

## 2020-04-30 DIAGNOSIS — F411 Generalized anxiety disorder: Secondary | ICD-10-CM

## 2020-04-30 NOTE — Assessment & Plan Note (Signed)
Tension-like. No red flags on history or exam. Recommend continued prn tylenol use and try heating pad.

## 2020-04-30 NOTE — Assessment & Plan Note (Signed)
Currently asymptomatic, obtaining labs today.

## 2020-04-30 NOTE — Assessment & Plan Note (Signed)
Obtaining labs today.

## 2020-04-30 NOTE — Assessment & Plan Note (Signed)
In the midst of regimen changes, continue to follow with GI. UTD on colonoscopy.

## 2020-04-30 NOTE — Patient Instructions (Addendum)
It was great to see you!  Our plans for today:  - Call your GYN to see about updating your pap smear.  - Ask your GI doctor about the need for pneumococcal vaccines. These are recommended for those on chronic immunosuppressive therapy or long-term steroids.  - Call to schedule your mammogram. - You can continue to take tylenol as needed for headaches. Trying a heating pad to loosen muscles can also be helpful. - If your symptoms changes with the headaches or dizzy spells, let us know. It can be helpful to keep a log of when spells occur and any associated symptoms you may have to identify potential triggers.  We are checking some labs today, we will release these results to your MyChart.  Take care and seek immediate care sooner if you develop any concerns.   Dr. Ky Barban

## 2020-04-30 NOTE — Assessment & Plan Note (Signed)
Few second, self-resolving and with no associated symptoms. With h/o vertigo and feels similar to prior. Normal neuro exam today and without red flags on history or exam. Recommend keeping diary of when symptoms occur and f/u if persistent or worsening.

## 2020-04-30 NOTE — Progress Notes (Signed)
BP 122/80   Pulse 87   Temp 98.4 F (36.9 C) (Oral)   Resp 16   Ht 5' 8.11" (1.73 m)   Wt 183 lb 12.8 oz (83.4 kg)   SpO2 98%   BMI 27.86 kg/m    Subjective:    Patient ID: Melissa Harrison, female    DOB: 28-Dec-1980, 40 y.o.   MRN: [id number]  HPI: Melissa Harrison is a 40 y.o. female presenting on 04/30/2020 for comprehensive medical examination. Current medical complaints include:headaches, dizzy spells.  IBD - follows with GI, last visit 02/2020. On lialda, planning for entyvio infusions with intent to d/c mesalamine once started. Previously on B12 injections, now with oral B12 in multivitamin.  Anxiety - Medications: lexapro 81m - Taking: as prescribed - Counseling: no - Symptoms: none currently - Current stressors: job change - Coping Mechanisms: exercise (walk, spin bike)  Hypothyroidism - Medications: Synthroid 1136m alternating with 12521m- Current symptoms:  none - Denies change in energy level, heat / cold intolerance, nervousness and palpitations - Symptoms have been well-controlled  Dizzy spells - lasting a few seconds for the past 3-4 months, about twice per week. No known trigger. No particular time of day. No N/V, vision changes. Some increase in stress, going through a job change, real estate.  Headaches - taking tylenol prn, helps. Getting more, different from prior migraines. No N/V. Frontal. Hasn't tried ice or heat. Lasts until takes tylenol.  She currently lives with: husband Menopausal Symptoms: no  Depression Screen done today and results listed below:  [facility]30[facility]2020 05/26/2019 04/03/2019 02/22/2019 09/02/2018  Decreased Interest 0 0 0 0 0  Down, Depressed, Hopeless 1 0 0 0 0  PHQ - 2 Score 1 0 0 0 0  Altered sleeping 1 0 0 0 0  Tired, decreased energy 1 3 0 0 0  Change in appetite 0 0 0 0 0  Feeling bad or failure about yourself  0 0 0 0 0  Trouble concentrating 0 0 0 0 0  Moving slowly or fidgety/restless 0 0 0 0  0  Suicidal thoughts 0 0 0 0 0  PHQ-9 Score 3 3 0 0 0  Difficult doing work/chores Not difficult at all Very difficult Not difficult at all Not difficult at all Not difficult at all    The patient does not have a history of falls. I did not complete a risk assessment for falls. A plan of care for falls was not documented.   Past Medical History:  Past Medical History:  Diagnosis Date  . Allergy   . Anemia   . Generalized anxiety disorder 10/26/2016  . Headache   . Hypothyroidism 08/14/2015  . Hypothyroidism due to Hashimoto's thyroiditis 09/07/2015  . Iron deficiency anemia 08/14/2015  . Uses central nervous system stimulants 03/12/2016  . Vitamin D deficiency 08/14/2015    Surgical History:  Past Surgical History:  Procedure Laterality Date  . CESAREAN SECTION      Medications:  Current Outpatient Medications on File Prior to Visit  Medication Sig  . cyanocobalamin (,VITAMIN B-12,) 1000 MCG/ML injection   . desogestrel-ethinyl estradiol (VIORELE) 0.15-0.02/0.01 MG (21/5) tablet TAKE 1 TABLET DAILY, SKIP  PLACEBO TABLETS  . escitalopram (LEXAPRO) 10 MG tablet TAKE 2 TABLETS(20 MG) BY MOUTH DAILY  . levothyroxine (SYNTHROID) 112 MCG tablet TAKE 1 TABLET BY MOUTH EVERY OTHER DAY, ALTERNATE WITH 125 MCG DOSE  . levothyroxine (SYNTHROID) 125 MCG tablet TAKE 1 TABLET BY MOUTH EVERY  OTHER DAY, ALTERNATE WITH 112 MCG DOSE  . LORazepam (ATIVAN) 0.5 MG tablet Take 0.5-1 tablets (0.25-0.5 mg total) by mouth 2 (two) times daily as needed for anxiety. No alcohol  . XIIDRA 5 % SOLN INT 1 GTT IN OU BID  . mesalamine (LIALDA) 1.2 g EC tablet Take 4 tablets by mouth daily.   No current facility-administered medications on file prior to visit.    Allergies:  No Known Allergies  Social History:  Social History   Socioeconomic History  . Marital status: Married    Spouse name: Not on file  . Number of children: 1  . Years of education: 78  . Highest education level: Not on file   Occupational History  . Occupation: Secondary school teacher    Comment: [place] Use  . Smoking status: Former Research scientist (life sciences)  . Smokeless tobacco: Never Used  Vaping Use  . Vaping Use: Never used  Substance and Sexual Activity  . Alcohol use: Yes    Alcohol/week: 0.0 standard drinks    Comment: an ocassional glass of wine  . Drug use: No  . Sexual activity: Yes    Partners: Male    Birth control/protection: None, Pill  Other Topics Concern  . Not on file  Social History Narrative   Pt lives in 1 story home with her husband and daughter   Has bachelor's degree   Works as Secondary school teacher.    Social Determinants of Health   Financial Resource Strain: Low Risk   . Difficulty of Paying Living Expenses: Not hard at all  Food Insecurity: No Food Insecurity  . Worried About Charity fundraiser in the Last Year: Never true  . Ran Out of Food in the Last Year: Never true  Transportation Needs: No Transportation Needs  . Lack of Transportation (Medical): No  . Lack of Transportation (Non-Medical): No  Physical Activity: Sufficiently Active  . Days of Exercise per Week: 5 days  . Minutes of Exercise per Session: 30 min  Stress: Stress Concern Present  . Feeling of Stress : To some extent  Social Connections: Moderately Isolated  . Frequency of Communication with Friends and Family: Three times a week  . Frequency of Social Gatherings with Friends and Family: Once a week  . Attends Religious Services: Never  . Active Member of Clubs or Organizations: No  . Attends Archivist Meetings: Never  . Marital Status: Married  Human resources officer Violence: Not At Risk  . Fear of Current or Ex-Partner: No  . Emotionally Abused: No  . Physically Abused: No  . Sexually Abused: No   Social History   Tobacco Use  Smoking Status Former Smoker  Smokeless Tobacco Never Used   Social History   Substance and Sexual Activity  Alcohol Use Yes  . Alcohol/week: 0.0 standard drinks    Comment: an ocassional glass of wine    Family History:  Family History  Problem Relation Age of Onset  . Cancer Father        LUNG  . Thyroid disease Mother   . Cancer Paternal Grandmother        breast cancer  . Diabetes Sister   . Diabetes Brother   . Heart disease Brother        from his diabetes, heart attack  . Hypertension Brother   . Kidney disease Brother   . Cancer Maternal Aunt        UTERINE  . Eczema Daughter   . Asthma  Daughter   . Heart attack Maternal Grandfather   . Cancer Paternal Grandfather        lung    Past medical history, surgical history, medications, allergies, family history and social history reviewed with patient today and changes made to appropriate areas of the chart.   ROS All other ROS negative except what is listed above and in the HPI.      Objective:    BP 122/80   Pulse 87   Temp 98.4 F (36.9 C) (Oral)   Resp 16   Ht 5' 8.11" (1.73 m)   Wt 183 lb 12.8 oz (83.4 kg)   SpO2 98%   BMI 27.86 kg/m   Wt Readings from Last 3 Encounters:  04/30/20 183 lb 12.8 oz (83.4 kg)  05/26/19 181 lb 3.2 oz (82.2 kg)  10/28/18 170 lb 3.2 oz (77.2 kg)    Physical Exam Constitutional:      Appearance: Normal appearance.  HENT:     Head: Normocephalic.     Right Ear: External ear normal.     Left Ear: External ear normal.     Nose: Nose normal.     Mouth/Throat:     Mouth: Mucous membranes are moist.     Pharynx: Oropharynx is clear.  Eyes:     General: No visual field deficit.    Extraocular Movements: Extraocular movements intact.     Pupils: Pupils are equal, round, and reactive to light.  Cardiovascular:     Rate and Rhythm: Normal rate and regular rhythm.     Heart sounds: Normal heart sounds.  Pulmonary:     Effort: Pulmonary effort is normal.     Breath sounds: Normal breath sounds.  Chest:  Breasts:     Right: Normal. No swelling, mass, nipple discharge, skin change, tenderness or axillary adenopathy.     Left: Normal. No  swelling, mass, nipple discharge, skin change, tenderness or axillary adenopathy.    Abdominal:     General: Bowel sounds are normal. There is no distension.     Palpations: Abdomen is soft.     Tenderness: There is no abdominal tenderness.  Musculoskeletal:        General: Normal range of motion.     Cervical back: Normal range of motion.     Right lower leg: No edema.     Left lower leg: No edema.  Lymphadenopathy:     Upper Body:     Right upper body: No axillary adenopathy.     Left upper body: No axillary adenopathy.  Skin:    General: Skin is warm.  Neurological:     Mental Status: She is alert and oriented to person, place, and time. Mental status is at baseline.     GCS: GCS eye subscore is 4. GCS verbal subscore is 5. GCS motor subscore is 6.     Cranial Nerves: Cranial nerves are intact. No dysarthria or facial asymmetry.     Sensory: Sensation is intact.     Motor: Motor function is intact. No weakness or abnormal muscle tone.     Coordination: Coordination is intact. Finger-Nose-Finger Test normal.     Gait: Gait is intact.  Psychiatric:        Mood and Affect: Mood normal.        Behavior: Behavior normal.     Results for orders placed or performed in visit on 11/08/19  TSH + free T4  Result Value Ref Range   TSH W/REFLEX TO  FT4 0.89 mIU/L      Assessment & Plan:   Problem List Items Addressed This Visit      Digestive   Ulcerative colitis ([place]) - Primary    In the midst of regimen changes, continue to follow with GI. UTD on colonoscopy.      Relevant Orders   B12   CBC     Endocrine   Hypothyroidism due to Hashimoto's thyroiditis    Obtaining labs today.      Relevant Orders   TSH   HgB A1c     Other   Anemia, iron deficiency    Currently asymptomatic, obtaining labs today.      Generalized anxiety disorder    Doing well on current regimen, no changes made today.       Headache    Tension-like. No red flags on history or exam.  Recommend continued prn tylenol use and try heating pad.      Episode of dizziness    Few second, self-resolving and with no associated symptoms. With h/o vertigo and feels similar to prior. Normal neuro exam today and without red flags on history or exam. Recommend keeping diary of when symptoms occur and f/u if persistent or worsening.        Other Visit Diagnoses    Need for hepatitis C screening test       Relevant Orders   Hepatitis C antibody   Screening for HIV (human immunodeficiency virus)       Relevant Orders   HIV antibody (with reflex)   Annual physical exam       Relevant Orders   Lipid panel   Encounter for screening mammogram for malignant neoplasm of breast       Relevant Orders   MM DIGITAL SCREENING BILATERAL       Follow up plan: Return in about 1 year (around 04/30/2021).   LABORATORY TESTING:  - Pap smear: pap done 2020 but not adequate sample. Recommend f/u with GYN soon for repeat sample.   IMMUNIZATIONS:   - Tdap: Tetanus vaccination status reviewed: last tetanus booster within 10 years. - Influenza: Up to date - Pneumovax: may be eligible, recommend checking with GI given change in meds recently. - Prevnar: may be eligible, recommend checking with GI given change in meds recently. - HPV: Not applicable - Shingrix vaccine: Not applicable  SCREENING: - Mammogram: Ordered today  - Colonoscopy: Up to date  - Bone Density: Not applicable  - Lung Cancer Screening: n/a  PATIENT COUNSELING:   Advised to take 1 mg of folate supplement per day if capable of pregnancy.   Sexuality: Discussed sexually transmitted diseases, partner selection, use of condoms, avoidance of unintended pregnancy  and contraceptive alternatives.   Advised to avoid cigarette smoking.  I discussed with the patient that most people either abstain from alcohol or drink within safe limits (<=14/week and <=4 drinks/occasion for males, <=7/weeks and <= 3 drinks/occasion for  females) and that the risk for alcohol disorders and other health effects rises proportionally with the number of drinks per week and how often a drinker exceeds daily limits.  Discussed cessation/primary prevention of drug use and availability of treatment for abuse.   Diet: Encouraged to adjust caloric intake to maintain  or achieve ideal body weight, to reduce intake of dietary saturated fat and total fat, to limit sodium intake by avoiding high sodium foods and not adding table salt, and to maintain adequate dietary potassium and calcium preferably from fresh  fruits, vegetables, and low-fat dairy products.    stressed the importance of regular exercise  Injury prevention: Discussed safety belts, safety helmets, smoke detector, smoking near bedding or upholstery.   Dental health: Discussed importance of regular tooth brushing, flossing, and dental visits.    NEXT PREVENTATIVE PHYSICAL DUE IN 1 YEAR. Return in about 1 year (around 04/30/2021).

## 2020-04-30 NOTE — Assessment & Plan Note (Signed)
Doing well on current regimen, no changes made today. 

## 2020-05-01 LAB — VITAMIN B12: Vitamin B-12: 266 pg/mL (ref 200–1100)

## 2020-05-01 LAB — HEMOGLOBIN A1C
Hgb A1c MFr Bld: 5.4 % of total Hgb (ref <5.7)
Mean Plasma Glucose: 108 mg/dL
eAG (mmol/L): 6 mmol/L

## 2020-05-01 LAB — HIV ANTIBODY (ROUTINE TESTING W REFLEX): HIV 1&2 Ab, 4th Generation: NONREACTIVE

## 2020-05-01 LAB — TSH: TSH: 0.59 mIU/L

## 2020-05-06 ENCOUNTER — Other Ambulatory Visit: Payer: Self-pay

## 2020-05-06 MED ORDER — LEVOTHYROXINE SODIUM 125 MCG PO TABS: ORAL_TABLET | ORAL | 3 refills | Status: DC

## 2020-05-16 ENCOUNTER — Ambulatory Visit
Admission: RE | Admit: 2020-05-16 | Discharge: 2020-05-16 | Disposition: A | Discharge status: Home / Self Care | Payer: BC Managed Care – PPO | Source: Ambulatory Visit | Attending: Family Medicine | Admitting: Family Medicine

## 2020-05-16 ENCOUNTER — Other Ambulatory Visit: Payer: Self-pay

## 2020-05-16 VITALS — BP 137/92 | HR 85 | Temp 98.1°F | Resp 18

## 2020-05-16 DIAGNOSIS — R221 Localized swelling, mass and lump, neck: Secondary | ICD-10-CM | POA: Diagnosis not present

## 2020-05-16 NOTE — ED Provider Notes (Signed)
[name]    CSN: [id number] Arrival date & time: 05/16/20  1257      History   Chief Complaint Chief Complaint  Patient presents with  . Facial Swelling    Right neck    HPI Melissa Harrison is a 40 y.o. female.   Pt is a 40 year old female that presents with right sided neck swelling. Noticed this am. Pain radiates into the ear.  No injuries to the neck. No lumps, fever, difficulty swallowing, sore throat.      Past Medical History:  Diagnosis Date  . Allergy   . Anemia   . Generalized anxiety disorder 10/26/2016  . Headache   . Hypothyroidism 08/14/2015  . Hypothyroidism due to Hashimoto's thyroiditis 09/07/2015  . Iron deficiency anemia 08/14/2015  . Uses central nervous system stimulants 03/12/2016  . Vitamin D deficiency 08/14/2015    Patient Active Problem List   Diagnosis Date Noted  . Headache 04/30/2020  . Episode of dizziness 04/30/2020  . Peripheral vasodilation ([place]) 04/03/2019  . Ulcerative colitis ([place]) 02/22/2019  . CRP elevated 04/27/2017  . Joint stiffness of knee, unspecified laterality 04/27/2017  . Cyclic citrullinated peptide (CCP) antibody positive 04/06/2017  . Generalized anxiety disorder 10/26/2016  . Sleep apnea 12/24/2015  . Hypothyroidism due to Hashimoto's thyroiditis 09/07/2015  . Chronically dry eyes 09/06/2015  . Vitamin D deficiency 08/14/2015  . Anemia, iron deficiency 08/14/2015    Past Surgical History:  Procedure Laterality Date  . CESAREAN SECTION      OB History    Gravida  1   Para      Term      Preterm      AB      Living  1     SAB      IAB      Ectopic      Multiple      Live Births  1            Home Medications    Prior to Admission medications   Medication Sig Start Date End Date Taking? Authorizing Provider  cyanocobalamin (,VITAMIN B-12,) 1000 MCG/ML injection  10/28/18   [provider]  desogestrel-ethinyl estradiol (VIORELE) 0.15-0.02/0.01 MG (21/5) tablet  TAKE 1 TABLET DAILY, SKIP  PLACEBO TABLETS 08/08/19   Towanda Malkin, MD  escitalopram (LEXAPRO) 10 MG tablet TAKE 2 TABLETS(20 MG) BY MOUTH DAILY 12/25/19   Towanda Malkin, MD  levothyroxine (SYNTHROID) 112 MCG tablet TAKE 1 TABLET BY MOUTH EVERY OTHER DAY, ALTERNATE WITH 125 MCG DOSE 01/25/20   Towanda Malkin, MD  levothyroxine (SYNTHROID) 125 MCG tablet TAKE 1 TABLET BY MOUTH EVERY OTHER DAY, ALTERNATE WITH 112 MCG DOSE 05/06/20   Myles Gip, DO  LORazepam (ATIVAN) 0.5 MG tablet Take 0.5-1 tablets (0.25-0.5 mg total) by mouth 2 (two) times daily as needed for anxiety. No alcohol 01/27/18   Arnetha Courser, MD  mesalamine (LIALDA) 1.2 g EC tablet Take 4 tablets by mouth daily. 02/07/19 02/07/20  [provider]  XIIDRA 5 % SOLN INT 1 GTT IN OU BID 01/09/19   [provider]    Family History Family History  Problem Relation Age of Onset  . Cancer Father        LUNG  . Thyroid disease Mother   . Cancer Paternal Grandmother        breast cancer  . Diabetes Sister   . Diabetes Brother   . Heart disease Brother  from his diabetes, heart attack  . Hypertension Brother   . Kidney disease Brother   . Cancer Maternal Aunt        UTERINE  . Eczema Daughter   . Asthma Daughter   . Heart attack Maternal Grandfather   . Cancer Paternal Grandfather        lung    Social History Social History   Tobacco Use  . Smoking status: Former Research scientist (life sciences)  . Smokeless tobacco: Never Used  Vaping Use  . Vaping Use: Never used  Substance Use Topics  . Alcohol use: Yes    Alcohol/week: 0.0 standard drinks    Comment: an ocassional glass of wine  . Drug use: No     Allergies   Patient has no known allergies.   Review of Systems Review of Systems   Physical Exam Triage Vital Signs ED Triage Vitals  Enc Vitals Group     BP 05/16/20 1308 (!) 137/92     Pulse Rate 05/16/20 1308 85     Resp 05/16/20 1308 18     Temp 05/16/20 1308 98.1 F  (36.7 C)     Temp Source 05/16/20 1308 Oral     SpO2 05/16/20 1308 97 %     Weight --      Height --      Head Circumference --      Peak Flow --      Pain Score 05/16/20 1312 0     Pain Loc --      Pain Edu? --      Excl. in [place]? --    No data found.  Updated Vital Signs BP (!) 137/92 (BP Location: Left Arm)   Pulse 85   Temp 98.1 F (36.7 C) (Oral)   Resp 18   SpO2 97%   Visual Acuity Right Eye Distance:   Left Eye Distance:   Bilateral Distance:    Right Eye Near:   Left Eye Near:    Bilateral Near:     Physical Exam Vitals and nursing note reviewed.  Constitutional:      General: She is not in acute distress.    Appearance: Normal appearance. She is not ill-appearing, toxic-appearing or diaphoretic.  HENT:     Head: Normocephalic.     Right Ear: Tympanic membrane and ear canal normal.     Nose: Nose normal.     Mouth/Throat:     Pharynx: Oropharynx is clear. No posterior oropharyngeal erythema.  Eyes:     Conjunctiva/sclera: Conjunctivae normal.  Neck:     Thyroid: No thyromegaly or thyroid tenderness.      Comments: Swelling to sternocleidomastoid muscle.  No palpable lumps or pain.  Pulmonary:     Effort: Pulmonary effort is normal.  Musculoskeletal:        General: Normal range of motion.     Cervical back: Normal range of motion. No signs of trauma. No pain with movement, spinous process tenderness or muscular tenderness. Normal range of motion.  Skin:    General: Skin is warm and dry.     Findings: No rash.  Neurological:     Mental Status: She is alert.  Psychiatric:        Mood and Affect: Mood normal.      UC Treatments / Results  Labs (all labs ordered are listed, but only abnormal results are displayed) Labs Reviewed - No data to display  EKG   Radiology No results found.  Procedures Procedures (including critical  care time)  Medications Ordered in UC Medications - No data to display  Initial Impression / Assessment and  Plan / UC Course  I have reviewed the triage vital signs and the nursing notes.  Pertinent labs & imaging results that were available during my care of the patient were reviewed by me and considered in my medical decision making (see chart for details).     Neck swelling Most likely muscular. Pt concerned. Will send for US of the neck for reassurance.  Recommended ice the area.   Final Clinical Impressions(s) / UC Diagnoses   Final diagnoses:  Neck swelling     Discharge Instructions     Go for the ultrasound tomorrow at 3:45  As we spoke about this may be muscular.  You can try ice to the area.  Ibuprofen or aleve to see if this helps with the swelling.      ED Prescriptions    None     PDMP not reviewed this encounter.   Orvan July, NP 05/17/20 [phone]

## 2020-05-16 NOTE — ED Triage Notes (Signed)
Pt presents today with c/o of right neck swelling noticed today, denies sore throat,

## 2020-05-16 NOTE — Discharge Instructions (Addendum)
Go for the ultrasound tomorrow at 3:45  As we spoke about this may be muscular.  You can try ice to the area.  Ibuprofen or aleve to see if this helps with the swelling.

## 2020-05-17 ENCOUNTER — Other Ambulatory Visit: Payer: Self-pay

## 2020-05-17 ENCOUNTER — Ambulatory Visit
Admission: RE | Admit: 2020-05-17 | Discharge: 2020-05-17 | Disposition: A | Discharge status: Home / Self Care | Payer: BC Managed Care – PPO | Source: Ambulatory Visit | Attending: Family Medicine | Admitting: Family Medicine

## 2020-05-17 DIAGNOSIS — R221 Localized swelling, mass and lump, neck: Secondary | ICD-10-CM | POA: Diagnosis not present

## 2020-05-17 DIAGNOSIS — R590 Localized enlarged lymph nodes: Secondary | ICD-10-CM | POA: Diagnosis not present

## 2020-05-17 DIAGNOSIS — K51018 Ulcerative (chronic) pancolitis with other complication: Secondary | ICD-10-CM | POA: Diagnosis not present

## 2020-05-17 DIAGNOSIS — M542 Cervicalgia: Secondary | ICD-10-CM | POA: Diagnosis not present

## 2020-07-01 ENCOUNTER — Encounter: Payer: Self-pay | Admitting: Family Medicine

## 2020-07-01 ENCOUNTER — Other Ambulatory Visit: Payer: Self-pay

## 2020-07-01 ENCOUNTER — Other Ambulatory Visit: Payer: Self-pay | Admitting: Emergency Medicine

## 2020-07-01 MED ORDER — LEVOTHYROXINE SODIUM 112 MCG PO TABS: ORAL_TABLET | ORAL | 1 refills | Status: DC

## 2020-07-06 ENCOUNTER — Other Ambulatory Visit: Payer: Self-pay | Admitting: Family Medicine

## 2020-07-06 DIAGNOSIS — F411 Generalized anxiety disorder: Secondary | ICD-10-CM

## 2020-07-06 NOTE — Telephone Encounter (Signed)
Need cosigner for Lexapro (all previous provider are no longer with practice)  Last OV 04/10/20 Last RF 12/25/19 RF due is on active med list and has a future visit with Delsa Grana PA-C  Shirley Friar last RF 02/22/19 historical provider and medication.

## 2020-07-09 ENCOUNTER — Telehealth: Payer: Self-pay

## 2020-07-09 NOTE — Telephone Encounter (Signed)
Copied from [facility] [id number]. Topic: General - Other >> Jul 09, 2020 10:48 AM Pawlus, Brayton Layman A wrote: Reason for CRM: Pharmacist calling to check up on a PA for XIIDRA 5 % SOLN. Caller stated they sent over a fax FYI

## 2020-07-10 NOTE — Telephone Encounter (Signed)
Melissa Harrison has did PA. Waiting on response back from insurance

## 2020-07-19 DIAGNOSIS — K5180 Other ulcerative colitis without complications: Secondary | ICD-10-CM | POA: Diagnosis not present

## 2020-07-25 DIAGNOSIS — K5190 Ulcerative colitis, unspecified, without complications: Secondary | ICD-10-CM | POA: Diagnosis not present

## 2020-07-25 DIAGNOSIS — D7219 Other eosinophilia: Secondary | ICD-10-CM | POA: Diagnosis not present

## 2020-08-04 ENCOUNTER — Other Ambulatory Visit: Payer: Self-pay | Admitting: Family Medicine

## 2020-08-04 DIAGNOSIS — Z3041 Encounter for surveillance of contraceptive pills: Secondary | ICD-10-CM

## 2020-08-04 NOTE — Telephone Encounter (Signed)
Requested medication (s) are due for refill today: yes  Requested medication (s) are on the active medication list: yes  Last refill:  08/08/19  Future visit scheduled: yes  Notes to clinic:  no mammogram noted in chart   Requested Prescriptions  Pending Prescriptions Disp Refills   VIORELE 0.15-0.02/0.01 MG (21/5) tablet [Pharmacy Med Name: VIORELE TABLETS 28S] 112 tablet     Sig: TAKE 1 TABLET BY MOUTH DAILY. SKIP PLACEBO TABLETS      OB/GYN:  Contraceptives Failed - 08/04/2020 11:35 AM      Failed - Last BP in normal range    BP Readings from Last 1 Encounters:  05/16/20 (!) 137/92          Passed - Valid encounter within last 12 months    Recent Outpatient Visits           3 months ago Ulcerative colitis without complications, unspecified location [facility])   [place], DO   1 year ago Hypothyroidism due to [facility] Towanda Malkin, MD   1 year ago Encounter for surveillance of contraceptive pills   [place], FNP   1 year ago Hypothyroidism due to Hashimoto's thyroiditis   [place], FNP   1 year ago Ear pain, right   [place], NP       Future Appointments             In 2 months Delsa Grana, PA-C [place] 0.15-0.02/0.01 MG (21/5) tablet [Pharmacy Med Name: [place]ice [place] TABLETS 28S] 112 tablet     Sig: TAKE 1 TABLET BY MOUTH DAILY. SKIP PLACEBO TABLETS      OB/GYN:  Contraceptives Failed - 08/04/2020 11:35 AM      Failed - Last BP in normal range    BP Readings from Last 1 Encounters:  05/16/20 (!) 137/92          Passed - Valid encounter within last 12 months    Recent Outpatient Visits           3 months ago Ulcerative colitis without complications, unspecified location [facility])   [place], DO   1 year ago Hypothyroidism due to Hashimoto's thyroiditis   [facility] Towanda Malkin, MD   1 year ago Encounter for surveillance of contraceptive pills   [place], FNP   1 year ago Hypothyroidism due to Hashimoto's thyroiditis   [place], FNP   1 year ago Ear pain, right   [place], [place], NP       Future Appointments             In 2 months Delsa Grana, PA-C [facility], [facility]

## 2020-08-06 ENCOUNTER — Encounter: Payer: Self-pay | Admitting: Family Medicine

## 2020-08-06 DIAGNOSIS — Z3041 Encounter for surveillance of contraceptive pills: Secondary | ICD-10-CM

## 2020-08-06 MED ORDER — DESOGESTREL-ETHINYL ESTRADIOL 0.15-0.02/0.01 MG (21/5) PO TABS: ORAL_TABLET | ORAL | 0 refills | Status: DC

## 2020-08-06 NOTE — Telephone Encounter (Signed)
Pt has an appt on 10/25/20

## 2020-08-08 ENCOUNTER — Other Ambulatory Visit: Payer: Self-pay

## 2020-08-08 DIAGNOSIS — Z1231 Encounter for screening mammogram for malignant neoplasm of breast: Secondary | ICD-10-CM

## 2020-08-09 ENCOUNTER — Other Ambulatory Visit: Payer: Self-pay | Admitting: Family Medicine

## 2020-08-09 DIAGNOSIS — Z1231 Encounter for screening mammogram for malignant neoplasm of breast: Secondary | ICD-10-CM

## 2020-08-19 ENCOUNTER — Other Ambulatory Visit: Payer: Self-pay

## 2020-08-19 ENCOUNTER — Ambulatory Visit
Admission: RE | Admit: 2020-08-19 | Discharge: 2020-08-19 | Disposition: A | Discharge status: Home / Self Care | Payer: BC Managed Care – PPO | Source: Ambulatory Visit | Attending: Family Medicine | Admitting: Family Medicine

## 2020-08-19 DIAGNOSIS — Z1231 Encounter for screening mammogram for malignant neoplasm of breast: Secondary | ICD-10-CM | POA: Diagnosis not present

## 2020-08-23 ENCOUNTER — Other Ambulatory Visit: Payer: Self-pay | Admitting: Family Medicine

## 2020-08-23 DIAGNOSIS — Z3041 Encounter for surveillance of contraceptive pills: Secondary | ICD-10-CM

## 2020-09-04 DIAGNOSIS — D225 Melanocytic nevi of trunk: Secondary | ICD-10-CM | POA: Diagnosis not present

## 2020-09-04 DIAGNOSIS — L812 Freckles: Secondary | ICD-10-CM | POA: Diagnosis not present

## 2020-09-04 DIAGNOSIS — L578 Other skin changes due to chronic exposure to nonionizing radiation: Secondary | ICD-10-CM | POA: Diagnosis not present

## 2020-09-04 DIAGNOSIS — L905 Scar conditions and fibrosis of skin: Secondary | ICD-10-CM | POA: Diagnosis not present

## 2020-09-13 ENCOUNTER — Other Ambulatory Visit: Payer: Self-pay

## 2020-09-13 ENCOUNTER — Ambulatory Visit
Admission: RE | Admit: 2020-09-13 | Discharge: 2020-09-13 | Disposition: A | Discharge status: Home / Self Care | Payer: BC Managed Care – PPO | Source: Ambulatory Visit | Attending: Emergency Medicine | Admitting: Emergency Medicine

## 2020-09-13 ENCOUNTER — Ambulatory Visit: Payer: Self-pay

## 2020-09-13 VITALS — BP 135/95 | HR 101 | Temp 99.0°F | Resp 17

## 2020-09-13 DIAGNOSIS — J0100 Acute maxillary sinusitis, unspecified: Secondary | ICD-10-CM

## 2020-09-13 DIAGNOSIS — K5190 Ulcerative colitis, unspecified, without complications: Secondary | ICD-10-CM | POA: Diagnosis not present

## 2020-09-13 LAB — POCT RAPID STREP A (OFFICE): Rapid Strep A Screen: NEGATIVE

## 2020-09-13 MED ORDER — AMOXICILLIN 875 MG PO TABS: 875.0000 mg | ORAL_TABLET | Freq: Two times a day (BID) | ORAL | 0 refills | Status: AC

## 2020-09-13 NOTE — ED Provider Notes (Signed)
[name]    CSN: [id number] Arrival date & time: 09/13/20  1111      History   Chief Complaint Chief Complaint  Patient presents with   Facial Pain   Cough   APPT 1115     HPI Melissa Harrison is a 40 y.o. female.  Patient presents with 1.5 week history of fever, headache, sinus pressure, congestion, sore throat, cough, shortness of breath.  Treatment attempted at home with OTC cold medication.  Patient reports negative COVID test at home x3.  Her medical history includes sort of colitis, anemia, hypothyroidism, anxiety.  The history is provided by the patient and medical records.   Past Medical History:  Diagnosis Date   Allergy    Anemia    Generalized anxiety disorder 10/26/2016   Headache    Hypothyroidism 08/14/2015   Hypothyroidism due to Hashimoto's thyroiditis 09/07/2015   Iron deficiency anemia 08/14/2015   Uses central nervous system stimulants 03/12/2016   Vitamin D deficiency 08/14/2015    Patient Active Problem List   Diagnosis Date Noted   Headache 04/30/2020   Episode of dizziness 04/30/2020   Peripheral vasodilation ([facility]) 04/03/2019   Ulcerative colitis ([place]) 02/22/2019   CRP elevated 04/27/2017   Joint stiffness of knee, unspecified laterality 47/65/4650   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 Authorizing Provider  amoxicillin  (AMOXIL) 875 MG tablet Take 1 tablet (875 mg total) by mouth 2 (two) times daily for 7 days. 09/13/20 09/20/20 Yes Sharion Balloon, NP  escitalopram (LEXAPRO) 10 MG tablet TAKE 2 TABLETS(20 MG) BY MOUTH DAILY 07/08/20  Yes Delsa Grana, PA-C  levothyroxine (SYNTHROID) 112 MCG tablet TAKE 1 TABLET BY MOUTH EVERY OTHER DAY, ALTERNATE WITH 125 MCG DOSE 07/01/20  Yes Sowles, Drue Stager, MD  levothyroxine (SYNTHROID) 125 MCG tablet TAKE 1 TABLET BY MOUTH EVERY OTHER DAY, ALTERNATE WITH 112 MCG DOSE 05/06/20  Yes Rumball, Alison M, DO  VIORELE 0.15-0.02/0.01 MG (21/5) tablet TAKE 1 TABLET BY MOUTH DAILY. SKIP PLACEBO TABLETS 08/26/20  Yes Delsa Grana, PA-C  cyanocobalamin (,VITAMIN B-12,) 1000 MCG/ML injection  10/28/18   [provider]  LORazepam (ATIVAN) 0.5 MG tablet Take 0.5-1 tablets (0.25-0.5 mg total) by mouth 2 (two) times daily as needed for anxiety. No alcohol 01/27/18   Arnetha Courser, MD  mesalamine (LIALDA) 1.2 g EC tablet Take 4 tablets by mouth daily. 02/07/19 02/07/20  [provider]  XIIDRA 5 % SOLN INSTILL 1 DROP IN BOTH EYES TWICE DAILY 07/08/20   Delsa Grana, PA-C    Family History Family History  Problem Relation  Age of Onset   Thyroid disease Mother    Cancer Father        LUNG   Diabetes Sister    Eczema Daughter    Asthma Daughter    Cancer Maternal Aunt        UTERINE   Heart attack Maternal Grandfather    Breast cancer Paternal Grandmother    Cancer Paternal Grandmother        breast cancer   Cancer Paternal Grandfather        lung   Diabetes Brother    Heart disease Brother        from his diabetes, heart attack   Hypertension Brother    Kidney disease Brother     Social History Social History   Tobacco Use   Smoking status: Former    Pack years: 0.00   Smokeless tobacco: Never  Vaping Use   Vaping Use: Never used  Substance Use Topics   Alcohol use: Yes    Alcohol/week: 0.0 standard drinks    Comment: an ocassional glass of wine   Drug use:  No     Allergies   Patient has no known allergies.   Review of Systems Review of Systems  Constitutional:  Positive for fever. Negative for chills.  HENT:  Positive for congestion, postnasal drip, sinus pressure and sore throat. Negative for ear pain.   Respiratory:  Positive for cough and shortness of breath.   Cardiovascular:  Negative for chest pain and palpitations.  Gastrointestinal:  Negative for abdominal pain and vomiting.  Skin:  Negative for color change and rash.  All other systems reviewed and are negative.   Physical Exam Triage Vital Signs ED Triage Vitals  Enc Vitals Group     BP      Pulse      Resp      Temp      Temp src      SpO2      Weight      Height      Head Circumference      Peak Flow      Pain Score      Pain Loc      Pain Edu?      Excl. in [place]?    No data found.  Updated Vital Signs BP (!) 135/95 (BP Location: Left Arm)   Pulse (!) 101   Temp 99 F (37.2 C) (Oral)   Resp 17   LMP  (LMP Unknown) Comment: " I take the active pills only"  SpO2 95%   Visual Acuity Right Eye Distance:   Left Eye Distance:   Bilateral Distance:    Right Eye Near:   Left Eye Near:    Bilateral Near:     Physical Exam Vitals and nursing note reviewed.  Constitutional:      General: She is not in acute distress.    Appearance: She is well-developed. She is not ill-appearing.  HENT:     Head: Normocephalic and atraumatic.     Right Ear: Tympanic membrane normal.     Left Ear: Tympanic membrane normal.     Nose: Congestion present.     Mouth/Throat:     Mouth: Mucous membranes are moist.     Pharynx: Posterior oropharyngeal erythema present.  Eyes:     Conjunctiva/sclera: Conjunctivae normal.  Cardiovascular:     Rate and Rhythm: Normal rate and regular rhythm.     Heart sounds: Normal heart sounds.  Pulmonary:  Effort: Pulmonary effort is normal. No respiratory distress.     Breath sounds: Normal breath sounds.  Abdominal:      Palpations: Abdomen is soft.     Tenderness: There is no abdominal tenderness.  Musculoskeletal:     Cervical back: Neck supple.  Skin:    General: Skin is warm and dry.  Neurological:     General: No focal deficit present.     Mental Status: She is alert and oriented to person, place, and time.     Gait: Gait normal.  Psychiatric:        Mood and Affect: Mood normal.        Behavior: Behavior normal.     UC Treatments / Results  Labs (all labs ordered are listed, but only abnormal results are displayed) Labs Reviewed  NOVEL CORONAVIRUS, NAA  POCT RAPID STREP A (OFFICE)    EKG   Radiology No results found.  Procedures Procedures (including critical care time)  Medications Ordered in UC Medications - No data to display  Initial Impression / Assessment and Plan / UC Course  I have reviewed the triage vital signs and the nursing notes.  Pertinent labs & imaging results that were available during my care of the patient were reviewed by me and considered in my medical decision making (see chart for details).  Acute sinusitis.  Treating with amoxicillin.  Rapid strep negative; PCR COVID pending.  Instructed patient to self quarantine per CDC guidelines.  Discussed symptomatic treatment including Tylenol or ibuprofen as needed.  Instructed her to follow-up with her PCP if her symptoms are not improving.  She agrees to plan of care.   Final Clinical Impressions(s) / UC Diagnoses   Final diagnoses:  Acute non-recurrent maxillary sinusitis     Discharge Instructions      Take the amoxicillin as directed.    Your rapid strep test is negative.    Your COVID test is pending.  You should self quarantine until the test result is back.    Take Tylenol or ibuprofen as needed for fever or discomfort.  Rest and keep yourself hydrated.    Follow-up with your primary care provider if your symptoms are not improving.         ED Prescriptions     Medication Sig  Dispense Auth. Provider   amoxicillin (AMOXIL) 875 MG tablet Take 1 tablet (875 mg total) by mouth 2 (two) times daily for 7 days. 14 tablet Sharion Balloon, NP      PDMP not reviewed this encounter.   Sharion Balloon, NP 09/13/20 1148

## 2020-09-13 NOTE — ED Triage Notes (Signed)
Patient c/o sinus pressure and productive cough w/ "dark green"mucus x 9 days.   Patient states " I've had a fever in the low 100s".   Patient endorses SOB, sore throat, and headache.   Patient endorses taking 3 at home COVID test with negative test results.   Patient has taken Tylenol , Mucinex-D, and "Sudafed cold relief" w/ no relief of symptoms.

## 2020-09-13 NOTE — Discharge Instructions (Addendum)
Take the amoxicillin as directed.    Your rapid strep test is negative.    Your COVID test is pending.  You should self quarantine until the test result is back.    Take Tylenol or ibuprofen as needed for fever or discomfort.  Rest and keep yourself hydrated.    Follow-up with your primary care provider if your symptoms are not improving.

## 2020-09-15 LAB — SARS-COV-2, NAA 2 DAY TAT

## 2020-09-15 LAB — NOVEL CORONAVIRUS, NAA: SARS-CoV-2, NAA: NOT DETECTED

## 2020-10-25 ENCOUNTER — Ambulatory Visit: Payer: BC Managed Care – PPO | Admitting: Family Medicine

## 2020-11-02 ENCOUNTER — Other Ambulatory Visit: Payer: Self-pay | Admitting: Family Medicine

## 2020-11-02 DIAGNOSIS — Z3041 Encounter for surveillance of contraceptive pills: Secondary | ICD-10-CM

## 2020-11-05 ENCOUNTER — Encounter: Payer: Self-pay | Admitting: Unknown Physician Specialty

## 2020-11-06 ENCOUNTER — Other Ambulatory Visit: Payer: Self-pay

## 2020-11-06 DIAGNOSIS — Z3041 Encounter for surveillance of contraceptive pills: Secondary | ICD-10-CM

## 2020-11-06 MED ORDER — DESOGESTREL-ETHINYL ESTRADIOL 0.15-0.02/0.01 MG (21/5) PO TABS: ORAL_TABLET | ORAL | 0 refills | Status: DC

## 2020-11-07 ENCOUNTER — Other Ambulatory Visit: Payer: Self-pay

## 2020-11-07 ENCOUNTER — Ambulatory Visit (INDEPENDENT_AMBULATORY_CARE_PROVIDER_SITE_OTHER): Payer: BC Managed Care – PPO | Admitting: Unknown Physician Specialty

## 2020-11-07 ENCOUNTER — Encounter: Payer: Self-pay | Admitting: Unknown Physician Specialty

## 2020-11-07 DIAGNOSIS — M25552 Pain in left hip: Secondary | ICD-10-CM

## 2020-11-07 DIAGNOSIS — F325 Major depressive disorder, single episode, in full remission: Secondary | ICD-10-CM | POA: Diagnosis not present

## 2020-11-07 DIAGNOSIS — E038 Other specified hypothyroidism: Secondary | ICD-10-CM | POA: Diagnosis not present

## 2020-11-07 DIAGNOSIS — E063 Autoimmune thyroiditis: Secondary | ICD-10-CM

## 2020-11-07 DIAGNOSIS — M25551 Pain in right hip: Secondary | ICD-10-CM | POA: Insufficient documentation

## 2020-11-07 NOTE — Assessment & Plan Note (Signed)
Symptoms stable.  Check TSH

## 2020-11-07 NOTE — Assessment & Plan Note (Signed)
Comes and goes but is now persistent.  Radiation of pain but does not seem like true sciatica due to lack of consistent radiation, bilateral, and intermittent.  Will refer to PT.  Tylenol is OK.

## 2020-11-07 NOTE — Progress Notes (Signed)
BP 118/84   Pulse 97   Temp 98 F (36.7 C)   Resp 16   Ht 5' 8"  (1.727 m)   Wt 185 lb 12.8 oz (84.3 kg)   SpO2 98%   BMI 28.25 kg/m    Subjective:    Patient ID: [name], female    DOB: 09/13/1980, 40 y.o.   MRN: [id number]  HPI: ORISSA ARREAGA is a 40 y.o. female  Chief Complaint  Patient presents with   Depression   Hypothyroidism   Pt is here to f/u on depression and thyroid  She is struggling with a "pinched nerve" for 2 weeks.  This is recurrent and typically resolves in a few days.  States it starts in her posterior lateral hip and radiates lateral lower leg but moves around.  No numbness.  No loss of bowel or bladder.  No fever.  Uses treadmill on an incline and sometimes runs when it is flat.  She walks her dog.  Does sciatic recommended stretches from the internet which helps.  Can't take Ibuprofen due to UC  Depression Lexapro working well for her Depression screen [facility] 2/9 11/07/2020 04/30/2020 05/26/2019 04/03/2019 02/22/2019  Decreased Interest 0 0 0 0 0  Down, Depressed, Hopeless 0 1 0 0 0  PHQ - 2 Score 0 1 0 0 0  Altered sleeping 0 1 0 0 0  Tired, decreased energy 0 1 3 0 0  Change in appetite 0 0 0 0 0  Feeling bad or failure about yourself  0 0 0 0 0  Trouble concentrating 0 0 0 0 0  Moving slowly or fidgety/restless 0 0 0 0 0  Suicidal thoughts 0 0 0 0 0  PHQ-9 Score 0 3 3 0 0  Difficult doing work/chores Not difficult at all Not difficult at all Very difficult Not difficult at all Not difficult at all  Some recent data might be hidden   Hypothyroid No recent weight loss or weight gain.  Mood is stable     Relevant past medical, surgical, family and social history reviewed and updated as indicated. Interim medical history since our last visit reviewed. Allergies and medications reviewed and updated.  Review of Systems  Per HPI unless specifically indicated above     Objective:    BP 118/84   Pulse 97   Temp 98 F (36.7 C)    Resp 16   Ht 5' 8"  (1.727 m)   Wt 185 lb 12.8 oz (84.3 kg)   SpO2 98%   BMI 28.25 kg/m   Wt Readings from Last 3 Encounters:  11/07/20 185 lb 12.8 oz (84.3 kg)  04/30/20 183 lb 12.8 oz (83.4 kg)  05/26/19 181 lb 3.2 oz (82.2 kg)    Physical Exam Constitutional:      General: She is not in acute distress.    Appearance: Normal appearance. She is well-developed.  HENT:     Head: Normocephalic and atraumatic.  Eyes:     General: Lids are normal. No scleral icterus.       Right eye: No discharge.        Left eye: No discharge.     Conjunctiva/sclera: Conjunctivae normal.  Neck:     Vascular: No carotid bruit or JVD.  Cardiovascular:     Rate and Rhythm: Normal rate and regular rhythm.     Heart sounds: Normal heart sounds.  Pulmonary:     Effort: Pulmonary effort is normal. No respiratory distress.  Breath sounds: Normal breath sounds.  Abdominal:     Palpations: There is no hepatomegaly or splenomegaly.  Musculoskeletal:        General: Normal range of motion.     Cervical back: Normal range of motion and neck supple.  Skin:    General: Skin is warm and dry.     Coloration: Skin is not pale.     Findings: No rash.  Neurological:     Mental Status: She is alert and oriented to person, place, and time.  Psychiatric:        Behavior: Behavior normal.        Thought Content: Thought content normal.        Judgment: Judgment normal.    Results for orders placed or performed during the hospital encounter of 09/13/20  Novel Coronavirus, NAA ([facility])   Specimen: Nasopharyngeal(NP) swabs in vial transport medium   Nasopharynge  Result Value Ref Range   SARS-CoV-2, NAA Not Detected Not Detected  SARS-COV-2, NAA 2 DAY TAT   Nasopharynge  Result Value Ref Range   SARS-CoV-2, NAA 2 DAY TAT Performed   POCT rapid strep A  Result Value Ref Range   Rapid Strep A Screen Negative Negative      Assessment & Plan:   Problem List Items Addressed This Visit        Unprioritized   Bilateral hip pain    Comes and goes but is now persistent.  Radiation of pain but does not seem like true sciatica due to lack of consistent radiation, bilateral, and intermittent.  Will refer to PT.  Tylenol is OK.        Relevant Orders   Ambulatory referral to Physical Therapy   DG Lumbar Spine Complete   Hypothyroidism due to Hashimoto's thyroiditis    Symptoms stable.  Check TSH      Relevant Orders   TSH   Major depression in remission ([place])    Stable, continue present medications.          Follow up plan: Return in about 6 months (around 05/07/2021).

## 2020-11-07 NOTE — Assessment & Plan Note (Signed)
Stable, continue present medications.   

## 2020-11-08 DIAGNOSIS — K5190 Ulcerative colitis, unspecified, without complications: Secondary | ICD-10-CM | POA: Diagnosis not present

## 2020-11-08 DIAGNOSIS — K51018 Ulcerative (chronic) pancolitis with other complication: Secondary | ICD-10-CM | POA: Diagnosis not present

## 2020-11-08 LAB — TSH: TSH: 0.87 mIU/L

## 2020-11-22 ENCOUNTER — Ambulatory Visit: Payer: BC Managed Care – PPO | Attending: Unknown Physician Specialty | Admitting: Physical Therapy

## 2020-11-25 ENCOUNTER — Ambulatory Visit: Payer: BC Managed Care – PPO | Admitting: Physical Therapy

## 2020-11-28 ENCOUNTER — Encounter: Payer: BC Managed Care – PPO | Admitting: Physical Therapy

## 2020-12-02 ENCOUNTER — Encounter: Payer: BC Managed Care – PPO | Admitting: Physical Therapy

## 2020-12-05 DIAGNOSIS — J3089 Other allergic rhinitis: Secondary | ICD-10-CM | POA: Diagnosis not present

## 2020-12-05 DIAGNOSIS — J309 Allergic rhinitis, unspecified: Secondary | ICD-10-CM | POA: Diagnosis not present

## 2020-12-05 DIAGNOSIS — J4530 Mild persistent asthma, uncomplicated: Secondary | ICD-10-CM | POA: Diagnosis not present

## 2020-12-06 ENCOUNTER — Encounter: Payer: BC Managed Care – PPO | Admitting: Physical Therapy

## 2020-12-09 ENCOUNTER — Ambulatory Visit: Payer: BC Managed Care – PPO | Admitting: Physical Therapy

## 2020-12-11 ENCOUNTER — Ambulatory Visit: Payer: BC Managed Care – PPO | Admitting: Physical Therapy

## 2020-12-16 ENCOUNTER — Encounter: Payer: BC Managed Care – PPO | Admitting: Physical Therapy

## 2020-12-18 ENCOUNTER — Encounter: Payer: BC Managed Care – PPO | Admitting: Physical Therapy

## 2020-12-19 ENCOUNTER — Encounter: Payer: Self-pay | Admitting: Family Medicine

## 2020-12-23 ENCOUNTER — Encounter: Payer: BC Managed Care – PPO | Admitting: Physical Therapy

## 2020-12-25 ENCOUNTER — Encounter: Payer: BC Managed Care – PPO | Admitting: Physical Therapy

## 2020-12-28 ENCOUNTER — Other Ambulatory Visit: Payer: Self-pay | Admitting: Family Medicine

## 2020-12-28 NOTE — Telephone Encounter (Signed)
Requested Prescriptions  Pending Prescriptions Disp Refills  . levothyroxine (SYNTHROID) 112 MCG tablet [Pharmacy Med Name: LEVOTHYROXINE 0.112MG (112MCG) TABS] 45 tablet 1    Sig: TAKE 1 TABLET BY MOUTH EVERY OTHER DAY, ALTERNATE WITH 125 MCG DOSE     Endocrinology:  Hypothyroid Agents Failed - 12/28/2020  6:19 AM      Failed - TSH needs to be rechecked within 3 months after an abnormal result. Refill until TSH is due.      Passed - TSH in normal range and within 360 days    TSH  Date Value Ref Range Status  11/07/2020 0.87 mIU/L Final    Comment:              Reference Range .           > or = 20 Years  0.40-4.50 .                Pregnancy Ranges           First trimester    0.26-2.66           Second trimester   0.55-2.73           Third trimester    0.43-2.91          Passed - Valid encounter within last 12 months    Recent Outpatient Visits          1 month ago Hypothyroidism due to [facility], Malachy Mood, NP   8 months ago Ulcerative colitis without complications, unspecified location [facility])   [place], DO   1 year ago Hypothyroidism due to Hashimoto's thyroiditis   [facility] Towanda Malkin, MD   1 year ago Encounter for surveillance of contraceptive pills   [place], FNP   1 year ago Hypothyroidism due to Hashimoto's thyroiditis   [place], [place]      Future Appointments            In 4 months Reece Packer, Myna Hidalgo, [facility], [facility]

## 2020-12-30 ENCOUNTER — Encounter: Payer: BC Managed Care – PPO | Admitting: Physical Therapy

## 2021-01-01 ENCOUNTER — Encounter: Payer: BC Managed Care – PPO | Admitting: Physical Therapy

## 2021-01-03 DIAGNOSIS — K51018 Ulcerative (chronic) pancolitis with other complication: Secondary | ICD-10-CM | POA: Diagnosis not present

## 2021-01-06 ENCOUNTER — Encounter: Payer: BC Managed Care – PPO | Admitting: Physical Therapy

## 2021-01-07 ENCOUNTER — Other Ambulatory Visit: Payer: Self-pay | Admitting: Family Medicine

## 2021-01-07 DIAGNOSIS — Z3041 Encounter for surveillance of contraceptive pills: Secondary | ICD-10-CM

## 2021-01-07 NOTE — Telephone Encounter (Signed)
Requested Prescriptions  Pending Prescriptions Disp Refills  . VIORELE 0.15-0.02/0.01 MG (21/5) tablet [Pharmacy Med Name: Lanice Shirts TABLETS 28S] 84 tablet 1    Sig: TAKE 1 TABLET BY MOUTH DAILY. SKIP PLACEBO TABLETS     OB/GYN:  Contraceptives Passed - 01/07/2021 10:33 AM      Passed - Last BP in normal range    BP Readings from Last 1 Encounters:  11/07/20 118/84         Passed - Valid encounter within last 12 months    Recent Outpatient Visits          2 months ago Hypothyroidism due to Hashimoto's thyroiditis   [facility] Kathrine Haddock, NP   8 months ago Ulcerative colitis without complications, unspecified location [facility])   [place], DO   1 year ago Hypothyroidism due to Hashimoto's thyroiditis   [facility] Tow[place] Malkin, MD   1 year ago Encounter for surveillance of contraceptive pills   [place], FNP   1 year ago Hypothyroidism due to [place], [place]      Future Appointments            In 4 months Reece Packer, Myna Hidalgo, [facility], [facility]

## 2021-01-08 ENCOUNTER — Encounter: Payer: BC Managed Care – PPO | Admitting: Physical Therapy

## 2021-01-13 ENCOUNTER — Encounter: Payer: BC Managed Care – PPO | Admitting: Physical Therapy

## 2021-01-15 ENCOUNTER — Encounter: Payer: BC Managed Care – PPO | Admitting: Physical Therapy

## 2021-01-20 ENCOUNTER — Encounter: Payer: BC Managed Care – PPO | Admitting: Physical Therapy

## 2021-01-20 ENCOUNTER — Ambulatory Visit
Admission: RE | Admit: 2021-01-20 | Discharge: 2021-01-20 | Disposition: A | Discharge status: Home / Self Care | Payer: BC Managed Care – PPO | Source: Ambulatory Visit | Attending: Emergency Medicine | Admitting: Emergency Medicine

## 2021-01-20 ENCOUNTER — Other Ambulatory Visit: Payer: Self-pay

## 2021-01-20 VITALS — BP 133/88 | HR 89 | Temp 98.7°F

## 2021-01-20 DIAGNOSIS — J0100 Acute maxillary sinusitis, unspecified: Secondary | ICD-10-CM

## 2021-01-20 MED ORDER — AMOXICILLIN 875 MG PO TABS: 875.0000 mg | ORAL_TABLET | Freq: Two times a day (BID) | ORAL | 0 refills | Status: AC

## 2021-01-20 NOTE — Discharge Instructions (Addendum)
Take the amoxicillin as directed.  Follow up with your primary care provider if your symptoms are not improving.   ° ° °

## 2021-01-20 NOTE — ED Provider Notes (Signed)
Melissa Harrison    CSN: [id number] Arrival date & time: 01/20/21  1154      History   Chief Complaint Chief Complaint  Patient presents with   Cough   Sinus Pressure/pain    HPI Melissa Harrison is a 40 y.o. female.  Presents with 5-6 week history of sinus congestion, sinus pressure, green nasal mucus.  She also reports mild nonproductive cough.  Treatment attempted with several OTC sinus medications.  She denies fever, chills, rash, shortness of breath, or other symptoms.  Her medical history includes allergies, ulcerative colitis, anemia, vitamin D deficiency, anxiety.  The history is provided by the patient and medical records.   Past Medical History:  Diagnosis Date   Allergy    Anemia    Generalized anxiety disorder 10/26/2016   Headache    Hypothyroidism 08/14/2015   Hypothyroidism due to Hashimoto's thyroiditis 09/07/2015   Iron deficiency anemia 08/14/2015   Uses central nervous system stimulants 03/12/2016   Vitamin D deficiency 08/14/2015    Patient Active Problem List   Diagnosis Date Noted   Major depression in remission ([place]) 11/07/2020   Bilateral hip pain 11/07/2020   Headache 04/30/2020   Episode of dizziness 04/30/2020   Peripheral vasodilation (HCC) 04/03/2019   Ulcerative colitis ([place]) 02/22/2019   CRP elevated 04/27/2017   Joint stiffness of knee, unspecified laterality 35/32/9924   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 Authorizing Provider  amoxicillin (AMOXIL) 875 MG tablet Take 1 tablet (875 mg total) by mouth 2 (two) times daily for 7 days. 01/20/21 01/27/21 Yes Sharion Balloon, NP  escitalopram (LEXAPRO) 10 MG tablet TAKE 2 TABLETS(20 MG) BY MOUTH DAILY 07/08/20  Yes Delsa Grana, PA-C  levothyroxine (SYNTHROID) 112 MCG tablet TAKE 1 TABLET BY MOUTH EVERY OTHER DAY, ALTERNATE WITH 125 MCG DOSE 12/28/20  Yes Sowles, Drue Stager, MD  VIORELE 0.15-0.02/0.01 MG (21/5) tablet TAKE 1 TABLET BY MOUTH DAILY. SKIP PLACEBO TABLETS 01/07/21  Yes Myles Gip, DO  levothyroxine (SYNTHROID) 125 MCG tablet TAKE 1 TABLET BY MOUTH EVERY OTHER DAY, ALTERNATE WITH 112 MCG DOSE 05/06/20   Myles Gip, DO  XIIDRA 5 % SOLN INSTILL 1 DROP IN BOTH EYES TWICE DAILY 07/08/20   Delsa Grana, PA-C    Family History Family History  Problem Relation Age of Onset   Thyroid disease Mother    Cancer Father        LUNG   Diabetes Sister    Eczema Daughter    Asthma Daughter    Cancer Maternal Aunt  UTERINE   Heart attack Maternal Grandfather    Breast cancer Paternal Grandmother    Cancer Paternal Grandmother        breast cancer   Cancer Paternal Grandfather        lung   Diabetes Brother    Heart disease Brother        from his diabetes, heart attack   Hypertension Brother    Kidney disease Brother     Social History Social History   Tobacco Use   Smoking status: Former   Smokeless tobacco: Never  Scientific laboratory technician Use: Never used  Substance Use Topics   Alcohol use: Yes    Alcohol/week: 0.0 standard drinks    Comment: an ocassional glass of wine   Drug use: No     Allergies   Patient has no known allergies.   Review of Systems Review of Systems  Constitutional:  Negative for chills and fever.  HENT:  Positive for congestion, postnasal drip and sinus pressure. Negative for ear pain and sore throat.    Respiratory:  Positive for cough. Negative for shortness of breath.   Cardiovascular:  Negative for chest pain and palpitations.  Gastrointestinal:  Negative for diarrhea and vomiting.  Skin:  Negative for color change and rash.  All other systems reviewed and are negative.   Physical Exam Triage Vital Signs ED Triage Vitals [01/20/21 1219]  Enc Vitals Group     BP 133/88     Pulse Rate 89     Resp      Temp 98.7 F (37.1 C)     Temp Source Oral     SpO2 96 %     Weight      Height      Head Circumference      Peak Flow      Pain Score      Pain Loc      Pain Edu?      Excl. in [place]?    No data found.  Updated Vital Signs BP 133/88 (BP Location: Left Arm)   Pulse 89   Temp 98.7 F (37.1 C) (Oral)   LMP  (LMP Unknown)   SpO2 96%   Visual Acuity Right Eye Distance:   Left Eye Distance:   Bilateral Distance:    Right Eye Near:   Left Eye Near:    Bilateral Near:     Physical Exam Vitals and nursing note reviewed.  Constitutional:      General: She is not in acute distress.    Appearance: She is well-developed. She is not ill-appearing.  HENT:     Head: Normocephalic and atraumatic.     Right Ear: Tympanic membrane normal.     Left Ear: Tympanic membrane normal.     Nose: Congestion present.     Mouth/Throat:     Mouth: Mucous membranes are moist.     Pharynx: Oropharynx is clear.  Eyes:     Conjunctiva/sclera: Conjunctivae normal.  Cardiovascular:     Rate and Rhythm: Normal rate and regular rhythm.     Heart sounds: Normal heart sounds.  Pulmonary:     Effort: Pulmonary effort is normal. No respiratory distress.     Breath sounds: Normal breath sounds.  Abdominal:     Palpations: Abdomen is soft.     Tenderness: There is no abdominal tenderness.  Musculoskeletal:     Cervical back: Neck supple.  Skin:    General: Skin is warm and dry.  Neurological:     Mental Status: She is alert.  Psychiatric:        Mood and Affect: Mood normal.         Behavior: Behavior normal.     UC Treatments / Results  Labs (all labs ordered are listed, but only abnormal results are displayed) Labs Reviewed - No data to display  EKG   Radiology No results found.  Procedures Procedures (including critical care time)  Medications Ordered in UC Medications - No data to display  Initial Impression / Assessment and Plan / UC Course  I have reviewed the triage vital signs and the nursing notes.  Pertinent labs & imaging results that were available during my care of the patient were reviewed by me and considered in my medical decision making (see chart for details).  Acute sinusitis.  Patient has been symptomatic for several weeks.  No improvement with OTC medications.  Treating today with amoxicillin.  Discussed symptomatic treatment.  Instructed her to follow-up with her PCP as needed.  She agrees to plan of care.   Final Clinical Impressions(s) / UC Diagnoses   Final diagnoses:  Acute non-recurrent maxillary sinusitis     Discharge Instructions      Take the amoxicillin as directed.  Follow up with your primary care provider if your symptoms are not improving.         ED Prescriptions     Medication Sig Dispense Auth. Provider   amoxicillin (AMOXIL) 875 MG tablet Take 1 tablet (875 mg total) by mouth 2 (two) times daily for 7 days. 14 tablet Sharion Balloon, NP      PDMP not reviewed this encounter.   Sharion Balloon, NP 01/20/21 1248

## 2021-01-20 NOTE — ED Triage Notes (Signed)
Pt c/o cough, congestion, and sinus pressure/pain x 5 weeks.

## 2021-01-22 ENCOUNTER — Encounter: Payer: BC Managed Care – PPO | Admitting: Physical Therapy

## 2021-01-26 ENCOUNTER — Ambulatory Visit: Admit: 2021-01-26 | Discharge status: Absent without leave | Payer: BC Managed Care – PPO

## 2021-01-26 DIAGNOSIS — S92353A Displaced fracture of fifth metatarsal bone, unspecified foot, initial encounter for closed fracture: Secondary | ICD-10-CM | POA: Insufficient documentation

## 2021-01-26 DIAGNOSIS — S92355A Nondisplaced fracture of fifth metatarsal bone, left foot, initial encounter for closed fracture: Secondary | ICD-10-CM | POA: Diagnosis not present

## 2021-01-27 ENCOUNTER — Encounter: Payer: BC Managed Care – PPO | Admitting: Physical Therapy

## 2021-01-27 ENCOUNTER — Encounter: Payer: Self-pay | Admitting: Family Medicine

## 2021-01-27 ENCOUNTER — Ambulatory Visit: Payer: BC Managed Care – PPO | Admitting: Family Medicine

## 2021-01-27 ENCOUNTER — Other Ambulatory Visit: Payer: Self-pay

## 2021-01-27 VITALS — BP 118/68 | HR 99 | Temp 98.7°F | Resp 16 | Ht 68.0 in | Wt 185.0 lb

## 2021-01-27 DIAGNOSIS — F411 Generalized anxiety disorder: Secondary | ICD-10-CM | POA: Diagnosis not present

## 2021-01-27 MED ORDER — SERTRALINE HCL 25 MG PO TABS: 25.0000 mg | ORAL_TABLET | Freq: Every day | ORAL | 0 refills | Status: DC

## 2021-01-27 NOTE — Assessment & Plan Note (Signed)
Symptoms well controlled on SSRI, will trial switch to different SSRI given concern for weight gain. Instructions given for taper. F/u in 4 weeks.

## 2021-01-27 NOTE — Progress Notes (Signed)
    SUBJECTIVE:   CHIEF COMPLAINT / HPI:   Anxiety - Medications: lexapro - Taking: good compliance, about 5 years. - feels weight gain is due to lexapro - working out every morning. Low carb diet. - Counseling: no - Previous hospitalizations: no - FH of psych illness: mom with anxiety - Symptoms: none currently  - Current stressors: none    OBJECTIVE:   BP 118/68   Pulse 99   Temp 98.7 F (37.1 C)   Resp 16   Ht 5' 8"  (1.727 m)   Wt 185 lb (83.9 kg)   LMP  (LMP Unknown)   SpO2 97%   BMI 28.13 kg/m   Gen: well appearing, in NAD Card: Reg rate Lungs: Comfortable WOB on RA Ext: WWP Psych: pleasant, mood and affect appropriate   ASSESSMENT/PLAN:   Generalized anxiety disorder Symptoms well controlled on SSRI, will trial switch to different SSRI given concern for weight gain. Instructions given for taper. F/u in 4 weeks.      Myles Gip, DO

## 2021-01-27 NOTE — Patient Instructions (Signed)
It was great to see you!  Our plans for today:  - Take 85m of lexapro for one week. Start the sertraline 290mat this time. Then, after one week, stop the lexapro, increase the sertraline to 50 mg (2 pills).  - Come back in 4 weeks.   Take care and seek immediate care sooner if you develop any concerns.   Dr. RuKy Barban

## 2021-02-03 ENCOUNTER — Encounter: Payer: BC Managed Care – PPO | Admitting: Physical Therapy

## 2021-02-05 ENCOUNTER — Encounter: Payer: BC Managed Care – PPO | Admitting: Physical Therapy

## 2021-02-26 ENCOUNTER — Other Ambulatory Visit: Payer: Self-pay

## 2021-02-26 ENCOUNTER — Encounter: Payer: Self-pay | Admitting: Nurse Practitioner

## 2021-02-26 ENCOUNTER — Ambulatory Visit: Payer: BC Managed Care – PPO | Admitting: Nurse Practitioner

## 2021-02-26 VITALS — BP 122/76 | HR 98 | Temp 98.0°F | Resp 16 | Ht 68.0 in | Wt 189.1 lb

## 2021-02-26 DIAGNOSIS — R635 Abnormal weight gain: Secondary | ICD-10-CM

## 2021-02-26 DIAGNOSIS — F411 Generalized anxiety disorder: Secondary | ICD-10-CM | POA: Diagnosis not present

## 2021-02-26 DIAGNOSIS — T50905A Adverse effect of unspecified drugs, medicaments and biological substances, initial encounter: Secondary | ICD-10-CM

## 2021-02-26 DIAGNOSIS — F325 Major depressive disorder, single episode, in full remission: Secondary | ICD-10-CM | POA: Diagnosis not present

## 2021-02-26 MED ORDER — SERTRALINE HCL 25 MG PO TABS: 25.0000 mg | ORAL_TABLET | Freq: Every day | ORAL | 1 refills | Status: DC

## 2021-02-26 NOTE — Progress Notes (Signed)
BP 122/76    Pulse 98    Temp 98 F (36.7 C) (Oral)    Resp 16    Ht 5\' 8"  (1.727 m)    Wt 189 lb 1.6 oz (85.8 kg)    SpO2 99%    BMI 28.75 kg/m    Subjective:    Patient ID: , female    DOB: 04-26-1980, 40 y.o.   MRN: [id number]  HPI: Melissa Harrison is a 40 y.o. female, here alone  Chief Complaint  Patient presents with   Follow-up    4 week recheck anxiety   Anxiety/ depression: Doing well.  She changed from Lexapro 10 mg to Zoloft 25 mg due to weight gain on 01/27/21.  She says she was able to tolerate the change and has not felt overly anxious.  GAD score is 0 today. She has not felt depressed either.  Her depression score is 1 today.  She denies any suicidal thoughts and says she is doing well.   Weight gain: Switched from Lexapro to Zoloft due to weight gain.  She is working on weight loss.  She says she eats a well balanced diet and exercises.  Discussed that she will continue to monitor weight and progress towards her desired goal.  She is currently 189 lbs.  She has gained four pounds since her last visit.  Current plan is to watch weight and see if it stabilizes over the next few months after stopping Lexapro.    GAD 7 : Generalized Anxiety Score 02/26/2021 05/26/2019  Nervous, Anxious, on Edge 0 2  Control/stop worrying 0 0  Worry too much - different things 0 0  Trouble relaxing 0 0  Restless 0 0  Easily annoyed or irritable 0 3  Afraid - awful might happen 0 0  Total GAD 7 Score 0 5  Anxiety Difficulty Not difficult at all Very difficult   Depression screen [facility] 2/9 02/26/2021 01/27/2021 11/07/2020 04/30/2020 05/26/2019  Decreased Interest 1 0 0 0 0  Down, Depressed, Hopeless 0 0 0 1 0  PHQ - 2 Score 1 0 0 1 0  Altered sleeping 0 0 0 1 0  Tired, decreased energy 0 0 0 1 3  Change in appetite 0 0 0 0 0  Feeling bad or failure about yourself  0 0 0 0 0  Trouble concentrating 0 0 0 0 0  Moving slowly or fidgety/restless 0 0 0 0 0  Suicidal  thoughts 0 0 0 0 0  PHQ-9 Score 1 0 0 3 3  Difficult doing work/chores Not difficult at all Not difficult at all Not difficult at all Not difficult at all Very difficult  Some recent data might be hidden      Relevant past medical, surgical, family and social history reviewed and updated as indicated. Interim medical history since our last visit reviewed. Allergies and medications reviewed and updated.  Review of Systems  Constitutional: Negative for fever, positive for weight change.  Respiratory: Negative for cough and shortness of breath.   Cardiovascular: Negative for chest pain or palpitations.  Gastrointestinal: Negative for abdominal pain, no bowel changes.  Musculoskeletal: Negative for gait problem or joint swelling.  Skin: Negative for rash.  Neurological: Negative for dizziness or headache.  No other specific complaints in a complete review of systems (except as listed in HPI above).      Objective:    BP 122/76    Pulse 98    Temp 98  F (36.7 C) (Oral)    Resp 16    Ht 5\' 8"  (1.727 m)    Wt 189 lb 1.6 oz (85.8 kg)    SpO2 99%    BMI 28.75 kg/m   Wt Readings from Last 3 Encounters:  02/26/21 189 lb 1.6 oz (85.8 kg)  01/27/21 185 lb (83.9 kg)  11/07/20 185 lb 12.8 oz (84.3 kg)    Physical Exam  Constitutional: Patient appears well-developed and well-nourished. Overweight No distress.  HEENT: head atraumatic, normocephalic, pupils equal and reactive to light,  neck supple Cardiovascular: Normal rate, regular rhythm and normal heart sounds.  No murmur heard. No BLE edema. Pulmonary/Chest: Effort normal and breath sounds normal. No respiratory distress. Abdominal: Soft.  There is no tenderness. Psychiatric: Patient has a normal mood and affect. behavior is normal. Judgment and thought content normal.   Results for orders placed or performed in visit on 11/07/20  TSH  Result Value Ref Range   TSH 0.87 mIU/L      Assessment & Plan:   1. Generalized anxiety  disorder  - sertraline (ZOLOFT) 25 MG tablet; Take 1 tablet (25 mg total) by mouth daily. For one week. After one week, increase to 50mg .  Dispense: 90 tablet; Refill: 1  2. Major depression in remission (HCC)  - sertraline (ZOLOFT) 25 MG tablet; Take 1 tablet (25 mg total) by mouth daily. For one week. After one week, increase to 50mg .  Dispense: 90 tablet; Refill: 1  3. Weight gain due to medication -continue to monitor -continue physical activity and portion control  Follow up plan: Return in about 3 months (around 05/27/2021) for follow up.

## 2021-03-12 ENCOUNTER — Ambulatory Visit: Payer: BC Managed Care – PPO | Admitting: Nurse Practitioner

## 2021-03-21 ENCOUNTER — Ambulatory Visit: Payer: BC Managed Care – PPO | Admitting: Nurse Practitioner

## 2021-03-21 ENCOUNTER — Other Ambulatory Visit: Payer: Self-pay

## 2021-03-21 ENCOUNTER — Encounter: Payer: Self-pay | Admitting: Nurse Practitioner

## 2021-03-21 VITALS — BP 110/78 | HR 100 | Temp 98.2°F | Resp 16 | Ht 68.0 in | Wt 187.3 lb

## 2021-03-21 DIAGNOSIS — R221 Localized swelling, mass and lump, neck: Secondary | ICD-10-CM | POA: Diagnosis not present

## 2021-03-21 DIAGNOSIS — E038 Other specified hypothyroidism: Secondary | ICD-10-CM

## 2021-03-21 DIAGNOSIS — E063 Autoimmune thyroiditis: Secondary | ICD-10-CM

## 2021-03-21 LAB — TSH: TSH: 1.22 mIU/L

## 2021-03-21 NOTE — Progress Notes (Signed)
° °  BP 110/78    Pulse 100    Temp 98.2 F (36.8 C) (Oral)    Resp 16    Ht 5\' 8"  (1.727 m)    Wt 187 lb 4.8 oz (85 kg)    SpO2 98%    BMI 28.48 kg/m    Subjective:    Patient ID: , female    DOB: 1980/09/18, 41 y.o.   MRN: [id number]  HPI: Melissa Harrison is a 41 y.o. female  Chief Complaint  Patient presents with   Referral    For Ultrasound, Patient stated she feels a nodule on her neck/throat   Lump on right side of neck:  She says a few weeks ago she noticed a lump on her right side of her neck.  She says it is not there now.  She denies any difficulty swallowing.  She denies any sore throat.  She said she first noticed right before she had Covid.  Discussed that it was likely a swollen lymph node. Reassurance given.    Hypothyroidism:  She is currently taking levothyroxine 125 mg and 112 mcg every other day for her hypothyroidism.  Last TSH was 11/07/20 it was 0.59.  Will recheck today. No nodules noted on exam.  Relevant past medical, surgical, family and social history reviewed and updated as indicated. Interim medical history since our last visit reviewed. Allergies and medications reviewed and updated.  Review of Systems  Constitutional: Negative for fever or weight change.  Respiratory: Negative for cough and shortness of breath.   Cardiovascular: Negative for chest pain or palpitations.  Gastrointestinal: Negative for abdominal pain, no bowel changes.  Musculoskeletal: Negative for gait problem or joint swelling.  Skin: Negative for rash.  Neurological: Negative for dizziness or headache.  No other specific complaints in a complete review of systems (except as listed in HPI above).      Objective:    BP 110/78    Pulse 100    Temp 98.2 F (36.8 C) (Oral)    Resp 16    Ht 5\' 8"  (1.727 m)    Wt 187 lb 4.8 oz (85 kg)    SpO2 98%    BMI 28.48 kg/m   Wt Readings from Last 3 Encounters:  03/21/21 187 lb 4.8 oz (85 kg)  02/26/21 189 lb 1.6 oz (85.8 kg)   01/27/21 185 lb (83.9 kg)    Physical Exam  Constitutional: Patient appears well-developed and well-nourished.  No distress.  HEENT: head atraumatic, normocephalic, pupils equal and reactive to light,  neck supple, no lumps or nodules noted, throat within normal limits Cardiovascular: Normal rate, regular rhythm and normal heart sounds.  No murmur heard. No BLE edema. Pulmonary/Chest: Effort normal and breath sounds normal. No respiratory distress. Abdominal: Soft.  There is no tenderness. Psychiatric: Patient has a normal mood and affect. behavior is normal. Judgment and thought content normal.   Results for orders placed or performed in visit on 11/07/20  TSH  Result Value Ref Range   TSH 0.87 mIU/L      Assessment & Plan:   1. Lump in neck -reassurance given -likely was a lymph node  2. Hypothyroidism due to Hashimoto's thyroiditis  - TSH   Follow up plan: Return if symptoms worsen or fail to improve.

## 2021-04-25 DIAGNOSIS — Z111 Encounter for screening for respiratory tuberculosis: Secondary | ICD-10-CM | POA: Diagnosis not present

## 2021-04-25 DIAGNOSIS — K5190 Ulcerative colitis, unspecified, without complications: Secondary | ICD-10-CM | POA: Diagnosis not present

## 2021-05-01 ENCOUNTER — Other Ambulatory Visit: Payer: Self-pay

## 2021-05-01 ENCOUNTER — Ambulatory Visit
Admission: RE | Admit: 2021-05-01 | Discharge: 2021-05-01 | Disposition: A | Discharge status: Home / Self Care | Payer: BC Managed Care – PPO | Source: Ambulatory Visit | Attending: Emergency Medicine | Admitting: Emergency Medicine

## 2021-05-01 VITALS — BP 142/94 | HR 83 | Temp 98.6°F | Resp 16

## 2021-05-01 DIAGNOSIS — J209 Acute bronchitis, unspecified: Secondary | ICD-10-CM

## 2021-05-01 DIAGNOSIS — J0110 Acute frontal sinusitis, unspecified: Secondary | ICD-10-CM

## 2021-05-01 MED ORDER — PREDNISONE 10 MG (21) PO TBPK: ORAL_TABLET | ORAL | 0 refills | Status: DC

## 2021-05-01 MED ORDER — AMOXICILLIN-POT CLAVULANATE 875-125 MG PO TABS: 1.0000 | ORAL_TABLET | Freq: Two times a day (BID) | ORAL | 0 refills | Status: AC

## 2021-05-01 NOTE — ED Provider Notes (Signed)
[facility]  ____________________________________________  Time seen: Approximately 9:56 AM  I have reviewed the triage vital signs and the nursing notes.   HISTORY  Chief Complaint Nasal Congestion, Chest Congestion, and Headache   Historian Patient     HPI Melissa Harrison is a 41 y.o. female presents to the urgent care with frontal sinus tenderness, teeth discomfort and chronic cough for the past 3 weeks.  She reports that her symptoms started as viral URI-like symptoms.  She denies fever.  No chest pain, chest tightness or shortness of breath.  Reports that her symptoms feel like a sinus infection.   Past Medical History:  Diagnosis Date   Allergy    Anemia    Generalized anxiety disorder 10/26/2016   Headache    Hypothyroidism 08/14/2015   Hypothyroidism due to Hashimoto's thyroiditis 09/07/2015   Iron deficiency anemia 08/14/2015   Uses central nervous system stimulants 03/12/2016   Vitamin D deficiency 08/14/2015     Immunizations up to date:  Yes.     Past Medical History:  Diagnosis Date   Allergy    Anemia    Generalized anxiety disorder 10/26/2016   Headache    Hypothyroidism 08/14/2015   Hypothyroidism due to Hashimoto's thyroiditis 09/07/2015   Iron deficiency anemia 08/14/2015   Uses central nervous system stimulants 03/12/2016   Vitamin D deficiency 08/14/2015    Patient Active Problem List   Diagnosis Date Noted   Closed fracture of fifth metatarsal bone 01/26/2021   Major depression in remission ([place]) 11/07/2020   Bilateral hip pain 11/07/2020   Headache 04/30/2020   Episode of dizziness 04/30/2020   Peripheral vasodilation (HCC) 04/03/2019   Ulcerative colitis ([place]) 02/22/2019   CRP elevated 04/27/2017   Joint stiffness of knee, unspecified laterality [id number]   Cyclic citrullinated peptide (CCP) antibody positive 04/06/2017   Generalized anxiety disorder 10/26/2016   Sleep apnea 12/24/2015   Hypothyroidism due to Hashimoto's thyroiditis  09/07/2015   Chronically dry eyes 09/06/2015   Vitamin D deficiency 08/14/2015   Anemia, iron deficiency 08/14/2015    Past Surgical History:  Procedure Laterality Date   CESAREAN SECTION      Prior to Admission medications   Medication Sig Start Date End Date Taking? Authorizing Provider  amoxicillin-clavulanate (AUGMENTIN) 875-125 MG tablet Take 1 tablet by mouth every 12 (twelve) hours for 10 days. 05/01/21 05/11/21 Yes Vallarie Mare M, PA-C  predniSONE (STERAPRED UNI-PAK 21 TAB) 10 MG (21) TBPK tablet Take 6 tablets the first day, take 5 tablets the second day, take 4 tablets the third day, take 3 tablets the fourth day, take 2 tablets the fifth day, take 1 tablet the sixth day. 05/01/21  Yes Vallarie Mare M, PA-C  albuterol (VENTOLIN HFA) 108 (90 Base) MCG/ACT inhaler SMARTSIG:1-2 Puff(s) By Mouth Every 4-6 Hours PRN 12/05/20   [provider]  BREO ELLIPTA 200-25 MCG/ACT AEPB 1 puff daily. 01/03/21   [provider]  ENTYVIO 300 MG injection Inject into the vein. 12/19/20   [provider]  levothyroxine (SYNTHROID) 112 MCG tablet TAKE 1 TABLET BY MOUTH EVERY OTHER DAY, ALTERNATE WITH 125 MCG DOSE 12/28/20   Ancil Boozer, Drue Stager, MD  levothyroxine (SYNTHROID) 125 MCG tablet TAKE 1 TABLET BY MOUTH EVERY OTHER DAY, ALTERNATE WITH 112 MCG DOSE 05/06/20   Myles Gip, DO  montelukast (SINGULAIR) 10 MG tablet Take 10 mg by mouth daily. 01/07/21   [provider]  RESTASIS 0.05 % ophthalmic emulsion  02/24/21   [provider]  sertraline (ZOLOFT) 25 MG tablet Take 1 tablet (25 mg total) by mouth daily. For one week. After one week, increase to 50mg . 02/26/21   Bo Merino, FNP  VIORELE 0.15-0.02/0.01 MG (21/5) tablet TAKE 1 TABLET BY MOUTH DAILY. SKIP PLACEBO TABLETS 01/07/21   Myles Gip, DO  XIIDRA 5 % SOLN INSTILL 1 DROP IN BOTH EYES TWICE DAILY 07/08/20   Delsa Grana, PA-C    Allergies Patient has no known allergies.  Family History   Problem Relation Age of Onset   Thyroid disease Mother    Cancer Father        LUNG   Diabetes Sister    Eczema Daughter    Asthma Daughter    Cancer Maternal Aunt        UTERINE   Heart attack Maternal Grandfather    Breast cancer Paternal Grandmother    Cancer Paternal Grandmother        breast cancer   Cancer Paternal Grandfather        lung   Diabetes Brother    Heart disease Brother        from his diabetes, heart attack   Hypertension Brother    Kidney disease Brother     Social History Social History   Tobacco Use   Smoking status: Former   Smokeless tobacco: Never  Scientific laboratory technician Use: Never used  Substance Use Topics   Alcohol use: Yes    Alcohol/week: 0.0 standard drinks    Comment: an ocassional glass of wine   Drug use: No     Review of Systems  Constitutional: No fever/chills Eyes:  No discharge ENT: Patient has frontal sinus tenderness.  Respiratory: no cough. No SOB/ use of accessory muscles to breath Gastrointestinal:   No nausea, no vomiting.  No diarrhea.  No constipation. Musculoskeletal: Negative for musculoskeletal pain. Skin: Negative for rash, abrasions, lacerations, ecchymosis.    ____________________________________________   PHYSICAL EXAM:  VITAL SIGNS: ED Triage Vitals  Enc Vitals Group     BP 05/01/21 0938 (!) 142/94     Pulse Rate 05/01/21 0938 83     Resp 05/01/21 0938 16     Temp 05/01/21 0938 98.6 F (37 C)     Temp Source 05/01/21 0938 Oral     SpO2 05/01/21 0938 98 %     Weight --      Height --      Head Circumference --      Peak Flow --      Pain Score 05/01/21 0945 4     Pain Loc --      Pain Edu? --      Excl. in [place]? --      Constitutional: Alert and oriented. Well appearing and in no acute distress. Eyes: Conjunctivae are normal. PERRL. EOMI. Head: Atraumatic. ENT:      Ears: TMs are effused bilaterally.      Nose: No congestion/rhinnorhea.      Mouth/Throat: Mucous membranes are moist.   Neck: No stridor.  No cervical spine tenderness to palpation. Cardiovascular: Normal rate, regular rhythm. Normal S1 and S2.  Good peripheral circulation. Respiratory: Normal respiratory effort without tachypnea or retractions. Lungs CTAB. Good air entry to the bases with no decreased or absent breath sounds Gastrointestinal: Bowel sounds x 4 quadrants. Soft and nontender to palpation. No guarding or rigidity. No distention. Musculoskeletal: Full range of motion to all extremities. No obvious deformities noted Neurologic:  Normal for age. No  gross focal neurologic deficits are appreciated.  Skin:  Skin is warm, dry and intact. No rash noted. Psychiatric: Mood and affect are normal for age. Speech and behavior are normal.   ____________________________________________   LABS (all labs ordered are listed, but only abnormal results are displayed)  Labs Reviewed - No data to display ____________________________________________  EKG   ____________________________________________  RADIOLOGY   No results found.  ____________________________________________    PROCEDURES  Procedure(s) performed:     Procedures     Medications - No data to display   ____________________________________________   INITIAL IMPRESSION / ASSESSMENT AND PLAN / ED COURSE  Pertinent labs & imaging results that were available during my care of the patient were reviewed by me and considered in my medical decision making (see chart for details).      Assessment and plan Sinusitis Bronchitis 41 year old female presents to the urgent care with cough for the past 3 weeks without fever and frontal sinus tenderness.  We will treat with Augmentin and taper prednisone.  Return precautions were given to return with new or worsening symptoms.  All patient questions were answered.     ____________________________________________  FINAL CLINICAL IMPRESSION(S) / ED DIAGNOSES  Final diagnoses:   Acute bronchitis, unspecified organism  Acute frontal sinusitis, recurrence not specified      NEW MEDICATIONS STARTED DURING THIS VISIT:  ED Discharge Orders          Ordered    amoxicillin-clavulanate (AUGMENTIN) 875-125 MG tablet  Every 12 hours        05/01/21 0956    predniSONE (STERAPRED UNI-PAK 21 TAB) 10 MG (21) TBPK tablet        05/01/21 [id number]                This chart was dictated using voice recognition software/Dragon. Despite best efforts to proofread, errors can occur which can change the meaning. Any change was purely unintentional.     Lannie Fields, [place] 05/01/21 [id number]

## 2021-05-01 NOTE — ED Triage Notes (Signed)
Pt here with sinus pressure and pain with headache and chest congestion x 3 weeks.

## 2021-05-01 NOTE — Discharge Instructions (Addendum)
Take Augmentin and prednisone as directed. ?

## 2021-05-07 ENCOUNTER — Ambulatory Visit: Payer: BC Managed Care – PPO | Admitting: Nurse Practitioner

## 2021-05-19 DIAGNOSIS — M25551 Pain in right hip: Secondary | ICD-10-CM | POA: Diagnosis not present

## 2021-05-24 ENCOUNTER — Other Ambulatory Visit: Payer: Self-pay | Admitting: Family Medicine

## 2021-05-24 DIAGNOSIS — Z3041 Encounter for surveillance of contraceptive pills: Secondary | ICD-10-CM

## 2021-05-26 NOTE — Telephone Encounter (Signed)
Requested Prescriptions  ?Pending Prescriptions Disp Refills  ?? VIORELE 0.15-0.02/0.01 MG (21/5) tablet [Pharmacy Med Name: Lanice Shirts TABLETS 28S] 84 tablet 1  ?  Sig: TAKE 1 TABLET BY MOUTH DAILY. SKIP PLACEBO TABLETS  ?  ? OB/GYN:  Contraceptives Failed - 05/24/2021  3:46 PM  ?  ?  Failed - Last BP in normal range  ?  BP Readings from Last 1 Encounters:  ?05/01/21 (!) 142/94  ?   ?  ?  Passed - Valid encounter within last 12 months  ?  Recent Outpatient Visits   ?      ? 2 months ago Lump in neck  ? [place], FNP  ? 2 months ago Generalized anxiety disorder  ? [place], FNP  ? 3 months ago Generalized anxiety disorder  ? [place], DO  ? 6 months ago Hypothyroidism due to Hashimoto's thyroiditis  ? [facility] [place], Malachy Mood, NP  ? 1 year ago Ulcerative colitis without complications, unspecified location [facility])  ? [place], DO  ?  ?  ?Future Appointments   ?        ? In 2 days Reece Packer, Myna Hidalgo, [facility], [place]  ?  ? ?  ?  ?  Passed - Patient is not a smoker  ?  ?  ? ?

## 2021-05-28 ENCOUNTER — Ambulatory Visit: Payer: BC Managed Care – PPO | Admitting: Nurse Practitioner

## 2021-05-28 ENCOUNTER — Encounter: Payer: Self-pay | Admitting: Nurse Practitioner

## 2021-05-28 ENCOUNTER — Other Ambulatory Visit: Payer: Self-pay

## 2021-05-28 VITALS — BP 112/74 | HR 91 | Temp 98.0°F | Resp 14 | Ht 68.0 in | Wt 185.5 lb

## 2021-05-28 DIAGNOSIS — F411 Generalized anxiety disorder: Secondary | ICD-10-CM

## 2021-05-28 DIAGNOSIS — E038 Other specified hypothyroidism: Secondary | ICD-10-CM

## 2021-05-28 DIAGNOSIS — E063 Autoimmune thyroiditis: Secondary | ICD-10-CM

## 2021-05-28 DIAGNOSIS — H9193 Unspecified hearing loss, bilateral: Secondary | ICD-10-CM

## 2021-05-28 DIAGNOSIS — F325 Major depressive disorder, single episode, in full remission: Secondary | ICD-10-CM | POA: Diagnosis not present

## 2021-05-28 NOTE — Progress Notes (Signed)
? ?BP 112/74   Pulse 91   Temp 98 ?F (36.7 ?C) (Oral)   Resp 14   Ht 5' 8"  (1.727 m)   Wt 185 lb 8 oz (84.1 kg)   SpO2 98%   BMI 28.21 kg/m?   ? ?Subjective:  ? ? Patient ID: Melissa Harrison, female    DOB: [birth year], 41 y.o.   MRN: [id number] ? ?HPI: ?Melissa Harrison is a 41 y.o. female, here alone ? ?Chief Complaint  ?Patient presents with  ? Anxiety  ?  Follow up  ? ?Anxiety: She says she is taking Zoloft 25 mg daily.  She says she is doing well on that dose.  She denies any suicidal thoughts.  She says since switching from lexapro to zoloft she has lost weight. She says she is feeling better and happy with the results of the new medication. Will continue with current treatment.  ? ?  05/28/2021  ?  9:30 AM 03/21/2021  ? 10:43 AM 02/26/2021  ?  9:42 AM 05/26/2019  ? 10:10 AM  ?GAD 7 : Generalized Anxiety Score  ?Nervous, Anxious, on Edge 0 0 0 2  ?Control/stop worrying 0 0 0 0  ?Worry too much - different things 0 0 0 0  ?Trouble relaxing 0 0 0 0  ?Restless 0 0 0 0  ?Easily annoyed or irritable 0 0 0 3  ?Afraid - awful might happen 0 0 0 0  ?Total GAD 7 Score 0 0 0 5  ?Anxiety Difficulty Not difficult at all Not difficult at all Not difficult at all Very difficult  ? ?  ? ?  05/28/2021  ?  9:30 AM 03/21/2021  ? 10:43 AM 02/26/2021  ?  9:41 AM 01/27/2021  ? 11:35 AM 11/07/2020  ?  9:34 AM  ?Depression screen PHQ 2/9  ?Decreased Interest 0 0 1 0 0  ?Down, Depressed, Hopeless 0 0 0 0 0  ?PHQ - 2 Score 0 0 1 0 0  ?Altered sleeping 0 0 0 0 0  ?Tired, decreased energy 0 0 0 0 0  ?Change in appetite 0 0 0 0 0  ?Feeling bad or failure about yourself  0 0 0 0 0  ?Trouble concentrating 0 0 0 0 0  ?Moving slowly or fidgety/restless 0 0 0 0 0  ?Suicidal thoughts 0 0 0 0 0  ?PHQ-9 Score 0 0 1 0 0  ?Difficult doing work/chores Not difficult at all Not difficult at all Not difficult at all Not difficult at all Not difficult at all  ?  ?Hearing problems:She says she has had hearing issues for years.  She says she has had  hearing checked before and she says it has gotten worse. Will place referral to ENT.  ? ?Hypothyroidism: She says she is doing well and is not having any constipation,palpitations, or weight gain. Last TSH was 1.22. She is currently taking levothyroxine 125 mcg and 112 mcg alternating days. Will continue with current treatment.   ? ?Relevant past medical, surgical, family and social history reviewed and updated as indicated. Interim medical history since our last visit reviewed. ?Allergies and medications reviewed and updated. ? ?Review of Systems ? ?Constitutional: Negative for fever or weight change.  ?Respiratory: Negative for cough and shortness of breath.   ?Cardiovascular: Negative for chest pain or palpitations.  ?Gastrointestinal: Negative for abdominal pain, no bowel changes.  ?Musculoskeletal: Negative for gait problem or joint swelling.  ?Skin: Negative for rash.  ?Neurological: Negative for dizziness  or headache.  ?No other specific complaints in a complete review of systems (except as listed in HPI above).  ? ?   ?Objective:  ?  ?BP 112/74   Pulse 91   Temp 98 ?F (36.7 ?C) (Oral)   Resp 14   Ht 5' 8"  (1.727 m)   Wt 185 lb 8 oz (84.1 kg)   SpO2 98%   BMI 28.21 kg/m?   ?Wt Readings from Last 3 Encounters:  ?05/28/21 185 lb 8 oz (84.1 kg)  ?03/21/21 187 lb 4.8 oz (85 kg)  ?02/26/21 189 lb 1.6 oz (85.8 kg)  ?  ?Physical Exam ? ?Constitutional: Patient appears well-developed and well-nourished. No distress.  ?HEENT: head atraumatic, normocephalic, pupils equal and reactive to light, ears TMs clear, neck supple, throat within normal limits ?Cardiovascular: Normal rate, regular rhythm and normal heart sounds.  No murmur heard. No BLE edema. ?Pulmonary/Chest: Effort normal and breath sounds normal. No respiratory distress. ?Abdominal: Soft.  There is no tenderness. ?Psychiatric: Patient has a normal mood and affect. behavior is normal. Judgment and thought content normal.  ? ?Results for orders placed or  performed in visit on 03/21/21  ?TSH  ?Result Value Ref Range  ? TSH 1.22 mIU/L  ? ?   ?Assessment & Plan:  ? ?1. Generalized anxiety disorder ?-continue taking zoloft 25 mg daily ? ?2. Major depression in remission [facility]) ?-continue taking zoloft 25 mg daily ? ?3. Hearing decreased, bilateral ? ?- Ambulatory referral to ENT ? ?4. Hypothyroidism due to Hashimoto's thyroiditis ?-continue with current treatment plan ? ?Follow up plan: ?Return in about 6 months (around 11/28/2021) for follow up. ? ? ? ? ? ?

## 2021-05-30 DIAGNOSIS — K5180 Other ulcerative colitis without complications: Secondary | ICD-10-CM | POA: Diagnosis not present

## 2021-05-30 DIAGNOSIS — R768 Other specified abnormal immunological findings in serum: Secondary | ICD-10-CM | POA: Diagnosis not present

## 2021-05-30 DIAGNOSIS — M25551 Pain in right hip: Secondary | ICD-10-CM | POA: Diagnosis not present

## 2021-06-11 ENCOUNTER — Encounter: Payer: Self-pay | Admitting: Nurse Practitioner

## 2021-06-11 ENCOUNTER — Other Ambulatory Visit: Payer: Self-pay | Admitting: Nurse Practitioner

## 2021-06-11 DIAGNOSIS — F325 Major depressive disorder, single episode, in full remission: Secondary | ICD-10-CM

## 2021-06-11 DIAGNOSIS — F411 Generalized anxiety disorder: Secondary | ICD-10-CM

## 2021-06-11 MED ORDER — SERTRALINE HCL 25 MG PO TABS: 25.0000 mg | ORAL_TABLET | Freq: Every day | ORAL | 1 refills | Status: DC

## 2021-06-12 NOTE — Telephone Encounter (Signed)
Duplicate request- Rx filled 06/11/21 ?Requested Prescriptions  ?Pending Prescriptions Disp Refills  ?? sertraline (ZOLOFT) 25 MG tablet [Pharmacy Med Name: SERTRALINE 25MG TABLETS] 90 tablet 1  ?  Sig: TAKE 1 TABLET BY MOUTH DAILY FOR 1 WEEK, THEN AFTER 1 WEEK INCREASE TO 2 TABLETS DAILY  ?  ? Not Delegated - Psychiatry:  Antidepressants - SSRI - sertraline Failed - 06/11/2021  9:32 AM  ?  ?  Failed - This refill cannot be delegated  ?  ?  Failed - AST in normal range and within 360 days  ?  AST  ?Date Value Ref Range Status  ?10/28/2018 12 0 - 40 IU/L Final  ?   ?  ?  Failed - ALT in normal range and within 360 days  ?  ALT  ?Date Value Ref Range Status  ?10/28/2018 15 0 - 32 IU/L Final  ?   ?  ?  Passed - Completed PHQ-2 or PHQ-9 in the last 360 days  ?  ?  Passed - Valid encounter within last 6 months  ?  Recent Outpatient Visits   ?      ? 2 weeks ago Generalized anxiety disorder  ? [place], FNP  ? 2 months ago Lump in neck  ? [place], FNP  ? 3 months ago Generalized anxiety disorder  ? [place], FNP  ? 4 months ago Generalized anxiety disorder  ? [place], DO  ? 7 months ago Hypothyroidism due to Hashimoto's thyroiditis  ? [facility] Kathrine Haddock, NP  ?  ?  ?Future Appointments   ?        ? In 5 months Reece Packer, Myna Hidalgo, [facility], [place]  ?  ? ?  ?  ?  ? ?

## 2021-06-16 ENCOUNTER — Encounter: Admission: RE | Payer: Self-pay | Source: Home / Self Care

## 2021-06-16 ENCOUNTER — Ambulatory Visit: Admission: RE | Admit: 2021-06-16 | Payer: BC Managed Care – PPO | Source: Home / Self Care

## 2021-06-16 SURGERY — COLONOSCOPY WITH PROPOFOL
Anesthesia: General

## 2021-06-18 ENCOUNTER — Other Ambulatory Visit: Payer: Self-pay | Admitting: Nurse Practitioner

## 2021-06-18 ENCOUNTER — Other Ambulatory Visit: Payer: Self-pay | Admitting: Family Medicine

## 2021-06-19 ENCOUNTER — Other Ambulatory Visit: Payer: Self-pay | Admitting: Nurse Practitioner

## 2021-06-19 ENCOUNTER — Other Ambulatory Visit: Payer: Self-pay

## 2021-06-19 MED ORDER — LEVOTHYROXINE SODIUM 125 MCG PO TABS: ORAL_TABLET | ORAL | 3 refills | Status: DC

## 2021-06-19 NOTE — Telephone Encounter (Signed)
Requested medication (s) are due for refill today: Yes ? ?Requested medication (s) are on the active medication list: Yes ? ?Last refill:  05/06/20 ? ?Future visit scheduled: Yes ? ?Notes to clinic:  Prescription expired. ? ? ? ?Requested Prescriptions  ?Pending Prescriptions Disp Refills  ? levothyroxine (SYNTHROID) 125 MCG tablet [Pharmacy Med Name: LEVOTHYROXINE 0.125MG (125MCG) TAB] 135 tablet 3  ?  Sig: TAKE 1 TABLET BY MOUTH EVERY OTHER DAY, ALTERNATE WITH 112 MCG DOSE  ?  ? Endocrinology:  Hypothyroid Agents Passed - 06/18/2021  2:44 PM  ?  ?  Passed - TSH in normal range and within 360 days  ?  TSH  ?Date Value Ref Range Status  ?03/21/2021 1.22 mIU/L Final  ?  Comment:  ?            Reference Range ?. ?          > or = 20 Years  0.40-4.50 ?. ?               Pregnancy Ranges ?          First trimester    0.26-2.66 ?          Second trimester   0.55-2.73 ?          Third trimester    0.43-2.91 ?  ?  ?  ?  ?  Passed - Valid encounter within last 12 months  ?  Recent Outpatient Visits   ? ?      ? 3 weeks ago Generalized anxiety disorder  ? [place], FNP  ? 3 months ago Lump in neck  ? [place], FNP  ? 3 months ago Generalized anxiety disorder  ? [place], FNP  ? 4 months ago Generalized anxiety disorder  ? [place], DO  ? 7 months ago Hypothyroidism due to Hashimoto's thyroiditis  ? [facility] Kathrine Haddock, NP  ? ?  ?  ?Future Appointments   ? ?        ? Tomorrow Reece Packer, Myna Hidalgo, [facility], [place]  ? In 5 months Reece Packer, Myna Hidalgo, [facility], [place]  ? ?  ? ?  ?  ?  ? ?

## 2021-06-19 NOTE — Telephone Encounter (Signed)
Medication Refill - Medication: levothyroxine (SYNTHROID) 125 MCG tablet  ? ?Has the patient contacted their pharmacy? Yes.  Need Rx  ? ?(Agent: If yes, when and what did the pharmacy advise?) need new Rx contact PCP ? ?Preferred Pharmacy (with phone number or street name):  ?[id number] [address], [address] Phone:  [phone]  ?Fax:  [phone]  ?  ? ?Has the patient been seen for an appointment in the last year OR does the patient have an upcoming appointment? Yes.   ? ?Agent: Please be advised that RX refills may take up to 3 business days. We ask that you follow-up with your pharmacy. ? ?

## 2021-06-19 NOTE — Telephone Encounter (Signed)
Requested Prescriptions  ?Pending Prescriptions Disp Refills  ?? levothyroxine (SYNTHROID) 125 MCG tablet 135 tablet 3  ?  Sig: TAKE 1 TABLET BY MOUTH EVERY OTHER DAY, ALTERNATE WITH 112 MCG DOSE  ?  ? Endocrinology:  Hypothyroid Agents Passed - 06/19/2021 11:25 AM  ?  ?  Passed - TSH in normal range and within 360 days  ?  TSH  ?Date Value Ref Range Status  ?03/21/2021 1.22 mIU/L Final  ?  Comment:  ?            Reference Range ?. ?          > or = 20 Years  0.40-4.50 ?. ?               Pregnancy Ranges ?          First trimester    0.26-2.66 ?          Second trimester   0.55-2.73 ?          Third trimester    0.43-2.91 ?  ?   ?  ?  Passed - Valid encounter within last 12 months  ?  Recent Outpatient Visits   ?      ? 3 weeks ago Generalized anxiety disorder  ? [place], FNP  ? 3 months ago Lump in neck  ? [place], FNP  ? 3 months ago Generalized anxiety disorder  ? [place], FNP  ? 4 months ago Generalized anxiety disorder  ? [place], DO  ? 7 months ago Hypothyroidism due to Hashimoto's thyroiditis  ? [facility] Kathrine Haddock, NP  ?  ?  ?Future Appointments   ?        ? Tomorrow Reece Packer, Myna Hidalgo, [facility], [place]  ? In 5 months Reece Packer, Myna Hidalgo, [facility], [place]  ?  ? ?  ?  ?  ? ? ?

## 2021-06-20 ENCOUNTER — Ambulatory Visit: Payer: BC Managed Care – PPO | Admitting: Nurse Practitioner

## 2021-06-20 DIAGNOSIS — K5180 Other ulcerative colitis without complications: Secondary | ICD-10-CM | POA: Diagnosis not present

## 2021-06-20 DIAGNOSIS — K31A0 Gastric intestinal metaplasia, unspecified: Secondary | ICD-10-CM | POA: Diagnosis not present

## 2021-06-20 DIAGNOSIS — K51018 Ulcerative (chronic) pancolitis with other complication: Secondary | ICD-10-CM | POA: Diagnosis not present

## 2021-06-20 DIAGNOSIS — D509 Iron deficiency anemia, unspecified: Secondary | ICD-10-CM | POA: Diagnosis not present

## 2021-06-27 DIAGNOSIS — M6281 Muscle weakness (generalized): Secondary | ICD-10-CM | POA: Diagnosis not present

## 2021-06-27 DIAGNOSIS — M25551 Pain in right hip: Secondary | ICD-10-CM | POA: Diagnosis not present

## 2021-06-27 DIAGNOSIS — M25552 Pain in left hip: Secondary | ICD-10-CM | POA: Diagnosis not present

## 2021-06-29 ENCOUNTER — Encounter: Payer: Self-pay | Admitting: Nurse Practitioner

## 2021-07-01 ENCOUNTER — Other Ambulatory Visit: Payer: Self-pay | Admitting: Nurse Practitioner

## 2021-07-01 DIAGNOSIS — F411 Generalized anxiety disorder: Secondary | ICD-10-CM

## 2021-07-01 MED ORDER — DIAZEPAM 2 MG PO TABS: 2.0000 mg | ORAL_TABLET | Freq: Four times a day (QID) | ORAL | 0 refills | Status: DC | PRN

## 2021-07-07 ENCOUNTER — Other Ambulatory Visit: Payer: Self-pay | Admitting: Nurse Practitioner

## 2021-07-07 DIAGNOSIS — Z1231 Encounter for screening mammogram for malignant neoplasm of breast: Secondary | ICD-10-CM

## 2021-07-09 DIAGNOSIS — M25552 Pain in left hip: Secondary | ICD-10-CM | POA: Diagnosis not present

## 2021-07-09 DIAGNOSIS — M6281 Muscle weakness (generalized): Secondary | ICD-10-CM | POA: Diagnosis not present

## 2021-07-09 DIAGNOSIS — M25551 Pain in right hip: Secondary | ICD-10-CM | POA: Diagnosis not present

## 2021-07-14 DIAGNOSIS — K2970 Gastritis, unspecified, without bleeding: Secondary | ICD-10-CM | POA: Diagnosis not present

## 2021-07-14 DIAGNOSIS — K31A12 Gastric intestinal metaplasia without dysplasia, involving the body (corpus): Secondary | ICD-10-CM | POA: Diagnosis not present

## 2021-07-14 DIAGNOSIS — K31A0 Gastric intestinal metaplasia, unspecified: Secondary | ICD-10-CM | POA: Diagnosis not present

## 2021-07-14 DIAGNOSIS — K6389 Other specified diseases of intestine: Secondary | ICD-10-CM | POA: Diagnosis not present

## 2021-07-14 DIAGNOSIS — D175 Benign lipomatous neoplasm of intra-abdominal organs: Secondary | ICD-10-CM | POA: Diagnosis not present

## 2021-07-14 DIAGNOSIS — K641 Second degree hemorrhoids: Secondary | ICD-10-CM | POA: Diagnosis not present

## 2021-07-14 DIAGNOSIS — K5289 Other specified noninfective gastroenteritis and colitis: Secondary | ICD-10-CM | POA: Diagnosis not present

## 2021-07-14 DIAGNOSIS — K5190 Ulcerative colitis, unspecified, without complications: Secondary | ICD-10-CM | POA: Diagnosis not present

## 2021-07-14 DIAGNOSIS — K5100 Ulcerative (chronic) pancolitis without complications: Secondary | ICD-10-CM | POA: Diagnosis not present

## 2021-07-31 DIAGNOSIS — J301 Allergic rhinitis due to pollen: Secondary | ICD-10-CM | POA: Diagnosis not present

## 2021-07-31 DIAGNOSIS — H903 Sensorineural hearing loss, bilateral: Secondary | ICD-10-CM | POA: Diagnosis not present

## 2021-07-31 DIAGNOSIS — G4733 Obstructive sleep apnea (adult) (pediatric): Secondary | ICD-10-CM | POA: Diagnosis not present

## 2021-08-01 ENCOUNTER — Other Ambulatory Visit: Payer: Self-pay | Admitting: Family Medicine

## 2021-08-01 ENCOUNTER — Other Ambulatory Visit: Payer: Self-pay | Admitting: Nurse Practitioner

## 2021-08-01 ENCOUNTER — Encounter: Payer: Self-pay | Admitting: Nurse Practitioner

## 2021-08-01 DIAGNOSIS — M25552 Pain in left hip: Secondary | ICD-10-CM | POA: Diagnosis not present

## 2021-08-01 DIAGNOSIS — M25551 Pain in right hip: Secondary | ICD-10-CM | POA: Diagnosis not present

## 2021-08-01 DIAGNOSIS — Z3041 Encounter for surveillance of contraceptive pills: Secondary | ICD-10-CM

## 2021-08-01 MED ORDER — DESOGESTREL-ETHINYL ESTRADIOL 0.15-0.02/0.01 MG (21/5) PO TABS: 1.0000 | ORAL_TABLET | Freq: Every day | ORAL | 11 refills | Status: DC

## 2021-08-15 DIAGNOSIS — K5180 Other ulcerative colitis without complications: Secondary | ICD-10-CM | POA: Diagnosis not present

## 2021-08-22 ENCOUNTER — Ambulatory Visit
Admission: RE | Admit: 2021-08-22 | Discharge: 2021-08-22 | Disposition: A | Discharge status: Home / Self Care | Payer: BC Managed Care – PPO | Source: Ambulatory Visit | Attending: Nurse Practitioner | Admitting: Nurse Practitioner

## 2021-08-22 DIAGNOSIS — Z1231 Encounter for screening mammogram for malignant neoplasm of breast: Secondary | ICD-10-CM | POA: Diagnosis not present

## 2021-08-22 DIAGNOSIS — H903 Sensorineural hearing loss, bilateral: Secondary | ICD-10-CM | POA: Diagnosis not present

## 2021-08-25 ENCOUNTER — Other Ambulatory Visit: Payer: Self-pay | Admitting: Nurse Practitioner

## 2021-08-25 DIAGNOSIS — R928 Other abnormal and inconclusive findings on diagnostic imaging of breast: Secondary | ICD-10-CM

## 2021-08-25 DIAGNOSIS — N6489 Other specified disorders of breast: Secondary | ICD-10-CM

## 2021-08-28 DIAGNOSIS — H903 Sensorineural hearing loss, bilateral: Secondary | ICD-10-CM | POA: Diagnosis not present

## 2021-09-06 DIAGNOSIS — N6099 Unspecified benign mammary dysplasia of unspecified breast: Secondary | ICD-10-CM

## 2021-09-06 HISTORY — DX: Unspecified benign mammary dysplasia of unspecified breast: N60.99

## 2021-09-10 ENCOUNTER — Ambulatory Visit
Admission: RE | Admit: 2021-09-10 | Discharge: 2021-09-10 | Disposition: A | Discharge status: Home / Self Care | Payer: BC Managed Care – PPO | Source: Ambulatory Visit | Attending: Nurse Practitioner | Admitting: Nurse Practitioner

## 2021-09-10 ENCOUNTER — Other Ambulatory Visit: Payer: Self-pay | Admitting: Nurse Practitioner

## 2021-09-10 DIAGNOSIS — R928 Other abnormal and inconclusive findings on diagnostic imaging of breast: Secondary | ICD-10-CM | POA: Insufficient documentation

## 2021-09-10 DIAGNOSIS — N6489 Other specified disorders of breast: Secondary | ICD-10-CM | POA: Insufficient documentation

## 2021-09-11 ENCOUNTER — Encounter: Payer: Self-pay | Admitting: Nurse Practitioner

## 2021-09-15 DIAGNOSIS — G4733 Obstructive sleep apnea (adult) (pediatric): Secondary | ICD-10-CM | POA: Diagnosis not present

## 2021-09-23 DIAGNOSIS — D225 Melanocytic nevi of trunk: Secondary | ICD-10-CM | POA: Diagnosis not present

## 2021-09-23 DIAGNOSIS — L578 Other skin changes due to chronic exposure to nonionizing radiation: Secondary | ICD-10-CM | POA: Diagnosis not present

## 2021-09-30 ENCOUNTER — Ambulatory Visit
Admission: RE | Admit: 2021-09-30 | Discharge: 2021-09-30 | Disposition: A | Discharge status: Home / Self Care | Payer: BC Managed Care – PPO | Source: Ambulatory Visit | Attending: Nurse Practitioner | Admitting: Nurse Practitioner

## 2021-09-30 DIAGNOSIS — N6321 Unspecified lump in the left breast, upper outer quadrant: Secondary | ICD-10-CM | POA: Diagnosis not present

## 2021-09-30 DIAGNOSIS — R928 Other abnormal and inconclusive findings on diagnostic imaging of breast: Secondary | ICD-10-CM | POA: Insufficient documentation

## 2021-09-30 DIAGNOSIS — D0510 Intraductal carcinoma in situ of unspecified breast: Secondary | ICD-10-CM | POA: Diagnosis not present

## 2021-09-30 DIAGNOSIS — D0512 Intraductal carcinoma in situ of left breast: Secondary | ICD-10-CM | POA: Diagnosis not present

## 2021-09-30 DIAGNOSIS — N6092 Unspecified benign mammary dysplasia of left breast: Secondary | ICD-10-CM | POA: Diagnosis not present

## 2021-09-30 DIAGNOSIS — N6082 Other benign mammary dysplasias of left breast: Secondary | ICD-10-CM | POA: Diagnosis not present

## 2021-09-30 HISTORY — PX: BREAST BIOPSY: SHX20

## 2021-10-02 ENCOUNTER — Other Ambulatory Visit: Payer: Self-pay | Admitting: Nurse Practitioner

## 2021-10-02 ENCOUNTER — Encounter: Payer: Self-pay | Admitting: *Deleted

## 2021-10-02 ENCOUNTER — Telehealth: Payer: Self-pay | Admitting: Nurse Practitioner

## 2021-10-02 DIAGNOSIS — D0512 Intraductal carcinoma in situ of left breast: Secondary | ICD-10-CM

## 2021-10-02 NOTE — Telephone Encounter (Signed)
Copied from [place] [id number]. Topic: General - Other >> Oct 02, 2021 10:47 AM Penni Bombard wrote: Reason for CRM: Pt called wanting Melissa Harrison to know she was just diagnosed with breast cancer.  She said she would know more about referrals after talking to her insurance

## 2021-10-02 NOTE — Progress Notes (Signed)
Received referral for newly diagnosed DCIS from [facility].  Navigation initiated.  Ms. Melissa Harrison would like to go to [facility] for surgeon and medical oncology consultations.   I explained that I couldn't initiate the referrals to [facility] and her PCP would need to.   In basket was sent to Serafina Royals, FNP letting her know patient would like to be referred to [facility].  Patient knows to call back if she has any questions or changes her mind and would like to be seen here.

## 2021-10-02 NOTE — Telephone Encounter (Signed)
FYI

## 2021-10-07 DIAGNOSIS — N6321 Unspecified lump in the left breast, upper outer quadrant: Secondary | ICD-10-CM | POA: Diagnosis not present

## 2021-10-07 DIAGNOSIS — R928 Other abnormal and inconclusive findings on diagnostic imaging of breast: Secondary | ICD-10-CM | POA: Diagnosis not present

## 2021-10-10 DIAGNOSIS — K51018 Ulcerative (chronic) pancolitis with other complication: Secondary | ICD-10-CM | POA: Diagnosis not present

## 2021-10-15 DIAGNOSIS — K5190 Ulcerative colitis, unspecified, without complications: Secondary | ICD-10-CM | POA: Diagnosis not present

## 2021-10-16 DIAGNOSIS — G4733 Obstructive sleep apnea (adult) (pediatric): Secondary | ICD-10-CM | POA: Diagnosis not present

## 2021-10-17 DIAGNOSIS — D0512 Intraductal carcinoma in situ of left breast: Secondary | ICD-10-CM | POA: Diagnosis not present

## 2021-10-17 DIAGNOSIS — G4733 Obstructive sleep apnea (adult) (pediatric): Secondary | ICD-10-CM | POA: Diagnosis not present

## 2021-10-17 DIAGNOSIS — N6092 Unspecified benign mammary dysplasia of left breast: Secondary | ICD-10-CM | POA: Diagnosis not present

## 2021-10-18 ENCOUNTER — Other Ambulatory Visit
Admission: RE | Admit: 2021-10-18 | Discharge: 2021-10-18 | Disposition: A | Discharge status: Home / Self Care | Payer: BC Managed Care – PPO | Source: Ambulatory Visit | Attending: Gastroenterology | Admitting: Gastroenterology

## 2021-10-18 DIAGNOSIS — R197 Diarrhea, unspecified: Secondary | ICD-10-CM | POA: Diagnosis not present

## 2021-10-18 LAB — GASTROINTESTINAL PANEL BY PCR, STOOL (REPLACES STOOL CULTURE)

## 2021-10-18 LAB — C DIFFICILE QUICK SCREEN W PCR REFLEX
C Diff antigen: POSITIVE — AB
C Diff toxin: NEGATIVE

## 2021-10-18 LAB — CLOSTRIDIUM DIFFICILE BY PCR, REFLEXED: Toxigenic C. Difficile by PCR: POSITIVE — AB

## 2021-10-21 DIAGNOSIS — Z6827 Body mass index (BMI) 27.0-27.9, adult: Secondary | ICD-10-CM | POA: Diagnosis not present

## 2021-10-21 DIAGNOSIS — N6092 Unspecified benign mammary dysplasia of left breast: Secondary | ICD-10-CM | POA: Diagnosis not present

## 2021-10-21 DIAGNOSIS — D0512 Intraductal carcinoma in situ of left breast: Secondary | ICD-10-CM | POA: Diagnosis not present

## 2021-10-21 DIAGNOSIS — E079 Disorder of thyroid, unspecified: Secondary | ICD-10-CM | POA: Diagnosis not present

## 2021-10-21 DIAGNOSIS — Z79890 Hormone replacement therapy (postmenopausal): Secondary | ICD-10-CM | POA: Diagnosis not present

## 2021-10-21 DIAGNOSIS — Z79899 Other long term (current) drug therapy: Secondary | ICD-10-CM | POA: Diagnosis not present

## 2021-10-21 DIAGNOSIS — N62 Hypertrophy of breast: Secondary | ICD-10-CM | POA: Diagnosis not present

## 2021-10-21 DIAGNOSIS — Z803 Family history of malignant neoplasm of breast: Secondary | ICD-10-CM | POA: Diagnosis not present

## 2021-10-21 LAB — SURGICAL PATHOLOGY

## 2021-11-16 DIAGNOSIS — G4733 Obstructive sleep apnea (adult) (pediatric): Secondary | ICD-10-CM | POA: Diagnosis not present

## 2021-11-17 DIAGNOSIS — G4733 Obstructive sleep apnea (adult) (pediatric): Secondary | ICD-10-CM | POA: Diagnosis not present

## 2021-11-21 ENCOUNTER — Telehealth: Payer: Self-pay | Admitting: Nurse Practitioner

## 2021-11-21 DIAGNOSIS — F411 Generalized anxiety disorder: Secondary | ICD-10-CM

## 2021-11-21 DIAGNOSIS — F325 Major depressive disorder, single episode, in full remission: Secondary | ICD-10-CM

## 2021-11-21 NOTE — Telephone Encounter (Signed)
Refilled 06/11/2021 #90 1 refill. Requested Prescriptions  Pending Prescriptions Disp Refills  . sertraline (ZOLOFT) 25 MG tablet [Pharmacy Med Name: SERTRALINE 25MG TABLETS] 90 tablet 1    Sig: TAKE 1 TABLET(25 MG) BY MOUTH DAILY     Psychiatry:  Antidepressants - SSRI - sertraline Failed - 11/21/2021 10:30 AM      Failed - AST in normal range and within 360 days    AST  Date Value Ref Range Status  10/28/2018 12 0 - 40 IU/L Final         Failed - ALT in normal range and within 360 days    ALT  Date Value Ref Range Status  10/28/2018 15 0 - 32 IU/L Final         Passed - Completed PHQ-2 or PHQ-9 in the last 360 days      Passed - Valid encounter within last 6 months    Recent Outpatient Visits          5 months ago Generalized anxiety disorder   [place], FNP   8 months ago Lump in neck   [place], FNP   8 months ago Generalized anxiety disorder   [place], FNP   9 months ago Generalized anxiety disorder   [place], DO   1 year ago Hypothyroidism due to [facility] Kathrine Haddock, NP      Future Appointments            In 1 week Reece Packer, Myna Hidalgo, [facility], [facility]

## 2021-11-24 NOTE — Telephone Encounter (Signed)
Patient must be seen. Its been 6 months

## 2021-11-24 NOTE — Telephone Encounter (Signed)
Pt has an appt on 11/28/21

## 2021-11-25 DIAGNOSIS — K5130 Ulcerative (chronic) rectosigmoiditis without complications: Secondary | ICD-10-CM | POA: Diagnosis not present

## 2021-11-25 DIAGNOSIS — A498 Other bacterial infections of unspecified site: Secondary | ICD-10-CM | POA: Diagnosis not present

## 2021-11-27 DIAGNOSIS — K5130 Ulcerative (chronic) rectosigmoiditis without complications: Secondary | ICD-10-CM | POA: Diagnosis not present

## 2021-11-27 DIAGNOSIS — A498 Other bacterial infections of unspecified site: Secondary | ICD-10-CM | POA: Diagnosis not present

## 2021-11-27 NOTE — Progress Notes (Signed)
BP 112/72   Pulse 98   Temp 98.1 F (36.7 C)   Resp 18   Ht 5' 8"  (1.727 m)   Wt 175 lb 8 oz (79.6 kg)   SpO2 98%   BMI 26.68 kg/m    Subjective:    Patient ID: Melissa Harrison, female    DOB: 12-22-1980, 41 y.o.   MRN: [id number]  HPI: Melissa Harrison is a 41 y.o. female, here alone  Chief Complaint  Patient presents with   Hypothyroidism   Anxiety   Depression   Depression/Anxiety: She is currently taking Zoloft 25 mg daily.  Patient reports she is doing well.  Her PHQ-9 and GAD scores are negative.  We will continue with current treatment.     11/28/2021    9:10 AM 05/28/2021    9:30 AM 03/21/2021   10:43 AM 02/26/2021    9:42 AM  GAD 7 : Generalized Anxiety Score  Nervous, Anxious, on Edge 0 0 0 0  Control/stop worrying 0 0 0 0  Worry too much - different things 0 0 0 0  Trouble relaxing 0 0 0 0  Restless 0 0 0 0  Easily annoyed or irritable 0 0 0 0  Afraid - awful might happen 0 0 0 0  Total GAD 7 Score 0 0 0 0  Anxiety Difficulty Not difficult at all Not difficult at all Not difficult at all Not difficult at all        11/28/2021    9:07 AM 05/28/2021    9:30 AM 03/21/2021   10:43 AM 02/26/2021    9:41 AM 01/27/2021   11:35 AM  Depression screen PHQ 2/9  Decreased Interest 0 0 0 1 0  Down, Depressed, Hopeless 0 0 0 0 0  PHQ - 2 Score 0 0 0 1 0  Altered sleeping 0 0 0 0 0  Tired, decreased energy 0 0 0 0 0  Change in appetite 0 0 0 0 0  Feeling bad or failure about yourself  0 0 0 0 0  Trouble concentrating 0 0 0 0 0  Moving slowly or fidgety/restless 0 0 0 0 0  Suicidal thoughts 0 0 0 0 0  PHQ-9 Score 0 0 0 1 0  Difficult doing work/chores Not difficult at all Not difficult at all Not difficult at all Not difficult at all Not difficult at all     Hypothyroidism: He is currently on levothyroxine 125 mcg and 112 mcg alternating days.  Her last TSH was 1.22 on 03/21/2021.  Patient reports she is doing well she denies having any constipation,  palpitations or weight gain.  We will get lab work today.  Atypical lobular hyperplasia (ALH) of left breast: Patient recently had a mammogram which showed 1. There is an incidentally sonographically identified 7 mm mass like area at 2:30 5 cm from the nipple which is indeterminate. Recommend ultrasound-guided biopsy for definitive characterization.  She has since had an ultrasound and a biopsy done.  After she spoke to the surgical oncologist they are going to keep an eye on it.  She is going to be free evaluated at the beginning of the year.   Relevant past medical, surgical, family and social history reviewed and updated as indicated. Interim medical history since our last visit reviewed. Allergies and medications reviewed and updated.  Review of Systems  Constitutional: Negative for fever or weight change.  Respiratory: Negative for cough and shortness of breath.   Cardiovascular:  Negative for chest pain or palpitations.  Gastrointestinal: Negative for abdominal pain, no bowel changes.  Musculoskeletal: Negative for gait problem or joint swelling.  Skin: Negative for rash.  Neurological: Negative for dizziness or headache.  No other specific complaints in a complete review of systems (except as listed in HPI above).      Objective:    BP 112/72   Pulse 98   Temp 98.1 F (36.7 C)   Resp 18   Ht 5' 8"  (1.727 m)   Wt 175 lb 8 oz (79.6 kg)   SpO2 98%   BMI 26.68 kg/m   Wt Readings from Last 3 Encounters:  11/28/21 175 lb 8 oz (79.6 kg)  05/28/21 185 lb 8 oz (84.1 kg)  03/21/21 187 lb 4.8 oz (85 kg)    Physical Exam  Constitutional: Patient appears well-developed and well-nourished. No distress.  HEENT: head atraumatic, normocephalic, pupils equal and reactive to light, ears TMs clear, neck supple, throat within normal limits Cardiovascular: Normal rate, regular rhythm and normal heart sounds.  No murmur heard. No BLE edema. Pulmonary/Chest: Effort normal and breath sounds  normal. No respiratory distress. Abdominal: Soft.  There is no tenderness. Psychiatric: Patient has a normal mood and affect. behavior is normal. Judgment and thought content normal.   Results for orders placed or performed during the hospital encounter of 10/18/21  C Difficile Quick Screen w PCR reflex   Specimen: STOOL  Result Value Ref Range   C Diff antigen POSITIVE (A) NEGATIVE   C Diff toxin NEGATIVE NEGATIVE   C Diff interpretation Results are indeterminate. See PCR results.   Gastrointestinal Panel by PCR , Stool   Specimen: Stool  Result Value Ref Range   Campylobacter species NOT DETECTED NOT DETECTED   Plesimonas shigelloides NOT DETECTED NOT DETECTED   Salmonella species NOT DETECTED NOT DETECTED   Yersinia enterocolitica NOT DETECTED NOT DETECTED   Vibrio species NOT DETECTED NOT DETECTED   Vibrio cholerae NOT DETECTED NOT DETECTED   Enteroaggregative E coli (EAEC) NOT DETECTED NOT DETECTED   Enteropathogenic E coli (EPEC) NOT DETECTED NOT DETECTED   Enterotoxigenic E coli (ETEC) NOT DETECTED NOT DETECTED   Shiga like toxin producing E coli (STEC) NOT DETECTED NOT DETECTED   Shigella/Enteroinvasive E coli (EIEC) NOT DETECTED NOT DETECTED   Cryptosporidium NOT DETECTED NOT DETECTED   Cyclospora cayetanensis NOT DETECTED NOT DETECTED   Entamoeba histolytica NOT DETECTED NOT DETECTED   Giardia lamblia NOT DETECTED NOT DETECTED   Adenovirus F40/41 NOT DETECTED NOT DETECTED   Astrovirus NOT DETECTED NOT DETECTED   Norovirus GI/GII NOT DETECTED NOT DETECTED   Rotavirus A NOT DETECTED NOT DETECTED   Sapovirus (I, II, IV, and V) NOT DETECTED NOT DETECTED  C. Diff by PCR, Reflexed  Result Value Ref Range   Toxigenic C. Difficile by PCR POSITIVE (A) NEGATIVE      Assessment & Plan:   Problem List Items Addressed This Visit       Endocrine   Hypothyroidism due to Hashimoto's thyroiditis    We will get labs today.  Continue taking levothyroxine 125 mcg and 112 mcg  alternating days.      Relevant Orders   TSH     Other   Generalized anxiety disorder - Primary    Currently doing well on Zoloft 25 mg daily we will continue with current treatment.      Major depression in remission [facility])    Currently doing well on Zoloft 25 mg daily  we will continue with current treatment.      Atypical lobular hyperplasia [facility]) of left breast    Follow-up appointment with surgical oncologist at the beginning of the year.      Other Visit Diagnoses     Need for influenza vaccination       Relevant Orders   Flu Vaccine QUAD 6+ mos PF IM (Fluarix Quad PF) (Completed)   Screening for cholesterol level       Relevant Orders   Lipid panel   Screening for diabetes mellitus       Relevant Orders   COMPLETE METABOLIC PANEL WITH GFR   Hemoglobin A1c   Screening for deficiency anemia       Relevant Orders   CBC with Differential/Platelet        Follow up plan: Return in about 6 months (around 05/29/2022) for follow up.

## 2021-11-28 ENCOUNTER — Other Ambulatory Visit: Payer: Self-pay

## 2021-11-28 ENCOUNTER — Ambulatory Visit: Payer: BC Managed Care – PPO | Admitting: Nurse Practitioner

## 2021-11-28 ENCOUNTER — Encounter: Payer: Self-pay | Admitting: Nurse Practitioner

## 2021-11-28 VITALS — BP 112/72 | HR 98 | Temp 98.1°F | Resp 18 | Ht 68.0 in | Wt 175.5 lb

## 2021-11-28 DIAGNOSIS — Z23 Encounter for immunization: Secondary | ICD-10-CM

## 2021-11-28 DIAGNOSIS — Z13220 Encounter for screening for lipoid disorders: Secondary | ICD-10-CM

## 2021-11-28 DIAGNOSIS — F411 Generalized anxiety disorder: Secondary | ICD-10-CM | POA: Diagnosis not present

## 2021-11-28 DIAGNOSIS — E038 Other specified hypothyroidism: Secondary | ICD-10-CM

## 2021-11-28 DIAGNOSIS — N6092 Unspecified benign mammary dysplasia of left breast: Secondary | ICD-10-CM | POA: Insufficient documentation

## 2021-11-28 DIAGNOSIS — Z130 Encounter for screening for diseases of the blood and blood-forming organs and certain disorders involving the immune mechanism: Secondary | ICD-10-CM | POA: Diagnosis not present

## 2021-11-28 DIAGNOSIS — F325 Major depressive disorder, single episode, in full remission: Secondary | ICD-10-CM | POA: Diagnosis not present

## 2021-11-28 DIAGNOSIS — E063 Autoimmune thyroiditis: Secondary | ICD-10-CM | POA: Diagnosis not present

## 2021-11-28 DIAGNOSIS — Z131 Encounter for screening for diabetes mellitus: Secondary | ICD-10-CM | POA: Diagnosis not present

## 2021-11-28 NOTE — Assessment & Plan Note (Signed)
Follow-up appointment with surgical oncologist at the beginning of the year.

## 2021-11-28 NOTE — Assessment & Plan Note (Signed)
Currently doing well on Zoloft 25 mg daily we will continue with current treatment.

## 2021-11-28 NOTE — Assessment & Plan Note (Signed)
We will get labs today.  Continue taking levothyroxine 125 mcg and 112 mcg alternating days.

## 2021-11-29 LAB — COMPLETE METABOLIC PANEL WITH GFR
AG Ratio: 1.5 (calc) (ref 1.0–2.5)
ALT: 15 U/L (ref 6–29)
AST: 13 U/L (ref 10–30)
Albumin: 4.1 g/dL (ref 3.6–5.1)
Alkaline phosphatase (APISO): 49 U/L (ref 31–125)
BUN: 12 mg/dL (ref 7–25)
CO2: 24 mmol/L (ref 20–32)
Calcium: 9.3 mg/dL (ref 8.6–10.2)
Chloride: 103 mmol/L (ref 98–110)
Creat: 0.66 mg/dL (ref 0.50–0.99)
Globulin: 2.8 g/dL (calc) (ref 1.9–3.7)
Glucose, Bld: 90 mg/dL (ref 65–99)
Potassium: 4.3 mmol/L (ref 3.5–5.3)
Sodium: 138 mmol/L (ref 135–146)
Total Bilirubin: 0.3 mg/dL (ref 0.2–1.2)
Total Protein: 6.9 g/dL (ref 6.1–8.1)
eGFR: 113 mL/min/{1.73_m2} (ref >=60)

## 2021-11-29 LAB — CBC WITH DIFFERENTIAL/PLATELET
Absolute Monocytes: 456 cells/uL (ref 200–950)
Basophils Absolute: 68 cells/uL (ref 0–200)
Basophils Relative: 1 %
Eosinophils Absolute: 530 cells/uL — ABNORMAL HIGH (ref 15–500)
Eosinophils Relative: 7.8 %
HCT: 40 % (ref 35.0–45.0)
Hemoglobin: 13.3 g/dL (ref 11.7–15.5)
Lymphs Abs: 1244 cells/uL (ref 850–3900)
MCH: 29.9 pg (ref 27.0–33.0)
MCHC: 33.3 g/dL (ref 32.0–36.0)
MCV: 89.9 fL (ref 80.0–100.0)
MPV: 10 fL (ref 7.5–12.5)
Monocytes Relative: 6.7 %
Neutro Abs: 4502 cells/uL (ref 1500–7800)
Neutrophils Relative %: 66.2 %
Platelets: 295 10*3/uL (ref 140–400)
RBC: 4.45 10*6/uL (ref 3.80–5.10)
RDW: 11.9 % (ref 11.0–15.0)
Total Lymphocyte: 18.3 %
WBC: 6.8 10*3/uL (ref 3.8–10.8)

## 2021-11-29 LAB — HEMOGLOBIN A1C
Hgb A1c MFr Bld: 5.3 % of total Hgb (ref <5.7)
Mean Plasma Glucose: 105 mg/dL
eAG (mmol/L): 5.8 mmol/L

## 2021-11-29 LAB — TSH: TSH: 1.53 mIU/L

## 2021-12-04 ENCOUNTER — Telehealth: Payer: Self-pay | Admitting: Nurse Practitioner

## 2021-12-04 ENCOUNTER — Encounter: Payer: Self-pay | Admitting: Nurse Practitioner

## 2021-12-04 ENCOUNTER — Other Ambulatory Visit: Payer: Self-pay | Admitting: Emergency Medicine

## 2021-12-04 DIAGNOSIS — F325 Major depressive disorder, single episode, in full remission: Secondary | ICD-10-CM

## 2021-12-04 DIAGNOSIS — F411 Generalized anxiety disorder: Secondary | ICD-10-CM

## 2021-12-04 MED ORDER — SERTRALINE HCL 25 MG PO TABS: 25.0000 mg | ORAL_TABLET | Freq: Every day | ORAL | 1 refills | Status: DC

## 2021-12-04 NOTE — Telephone Encounter (Signed)
[facility] called and spoke to [place], [employer] about the refill(s) sertraline requested. Advised it was sent on 06/11/21 #90/1 refill(s). She says patient picked up #90 on 06/13/21 and picked up #90 on 08/28/21, so no more refills on file. Will go ahead and refill.

## 2021-12-04 NOTE — Telephone Encounter (Signed)
Refilled 06/11/2021 #901 rf. Requested Prescriptions  Pending Prescriptions Disp Refills  . sertraline (ZOLOFT) 25 MG tablet [Pharmacy Med Name: SERTRALINE 25MG TABLETS] 90 tablet 1    Sig: TAKE 1 TABLET(25 MG) BY MOUTH DAILY     Psychiatry:  Antidepressants - SSRI - sertraline Passed - 12/04/2021 10:49 AM      Passed - AST in normal range and within 360 days    AST  Date Value Ref Range Status  11/28/2021 13 10 - 30 U/L Final         Passed - ALT in normal range and within 360 days    ALT  Date Value Ref Range Status  11/28/2021 15 6 - 29 U/L Final         Passed - Completed PHQ-2 or PHQ-9 in the last 360 days      Passed - Valid encounter within last 6 months    Recent Outpatient Visits          6 days ago Generalized anxiety disorder   [place], FNP   6 months ago Generalized anxiety disorder   [place], FNP   8 months ago Lump in neck   [facility] Bo [place], FNP   9 months ago Generalized anxiety disorder   [place], FNP   10 months ago Generalized anxiety disorder   [place], DO      Future Appointments            In 5 months Steele Sizer, MD [facility], [facility]

## 2021-12-04 NOTE — Telephone Encounter (Signed)
Pt called back to report that she only has one more pill left. Please advise

## 2021-12-04 NOTE — Telephone Encounter (Signed)
Order pend to you. Please refill medication seen on 9/22

## 2021-12-04 NOTE — Addendum Note (Signed)
Addended by: Matilde Sprang on: 12/04/2021 02:30 PM   Modules accepted: Orders

## 2021-12-08 IMAGING — MG MM DIGITAL SCREENING BILAT W/ TOMO AND CAD
6 of 10 series · 6 of 30 positions shown · non-contrast
Comparison: None.

CLINICAL DATA: Screening.

EXAM:
DIGITAL SCREENING BILATERAL MAMMOGRAM WITH TOMOSYNTHESIS AND CAD
TECHNIQUE: Bilateral screening digital craniocaudal and mediolateral oblique
mammograms were obtained. Bilateral screening digital breast
tomosynthesis was performed. The images were evaluated with
computer-aided detection.

[R CC synth-2D]
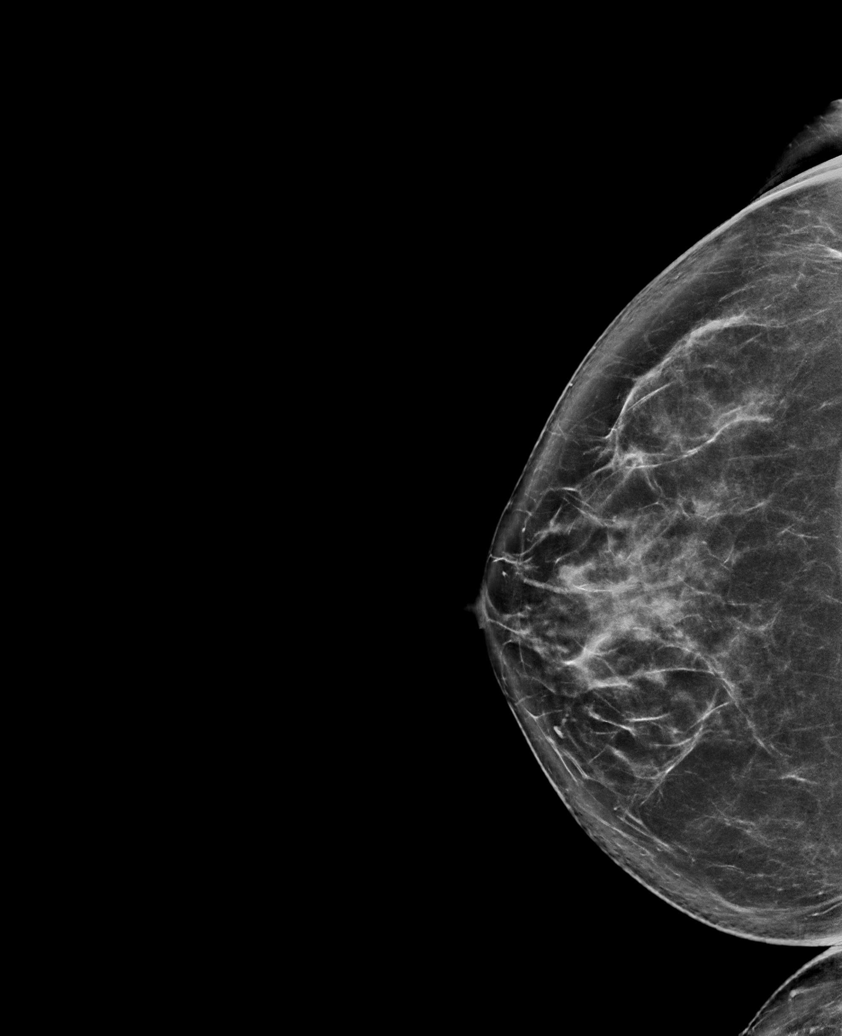

[L CC synth-2D]
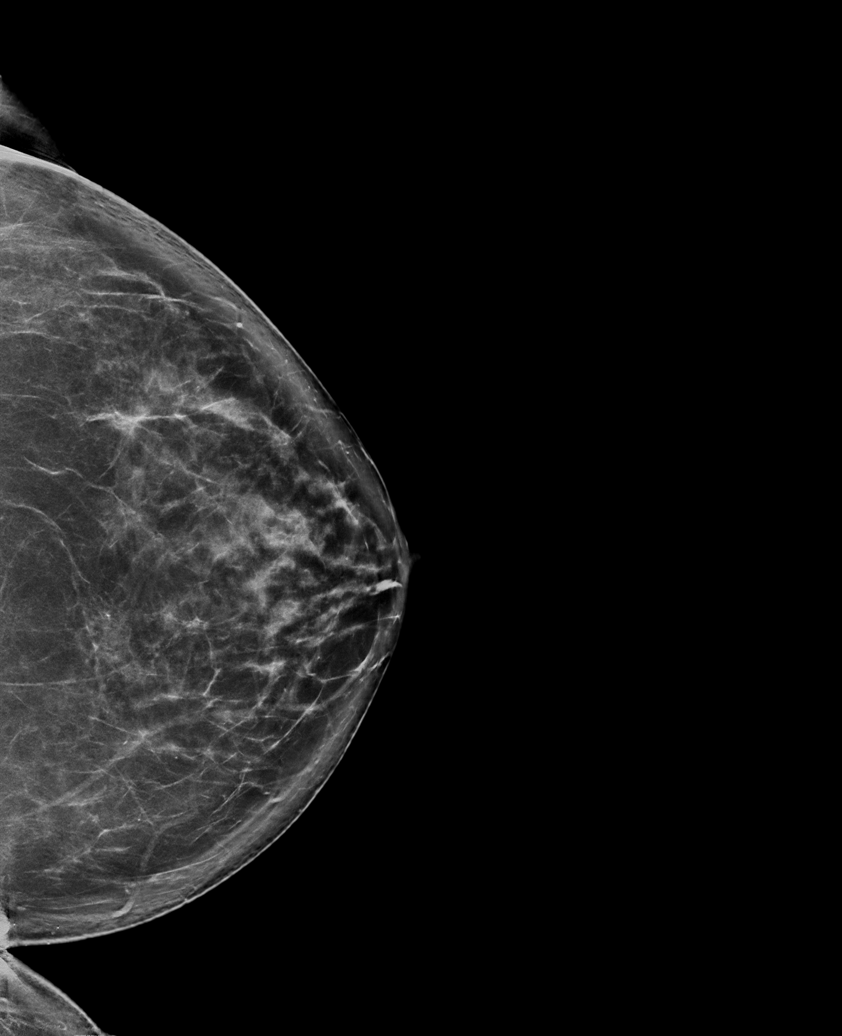

[R MLO synth-2D]
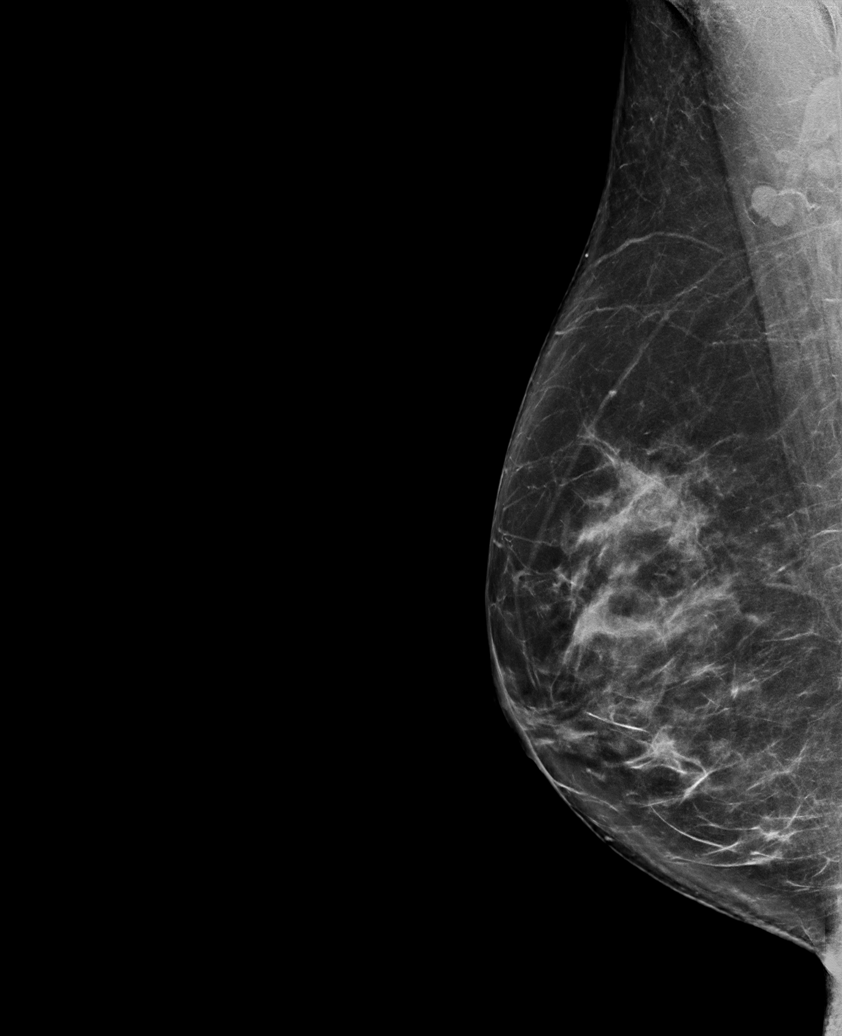

[L MLO synth-2D (1 of 2)]
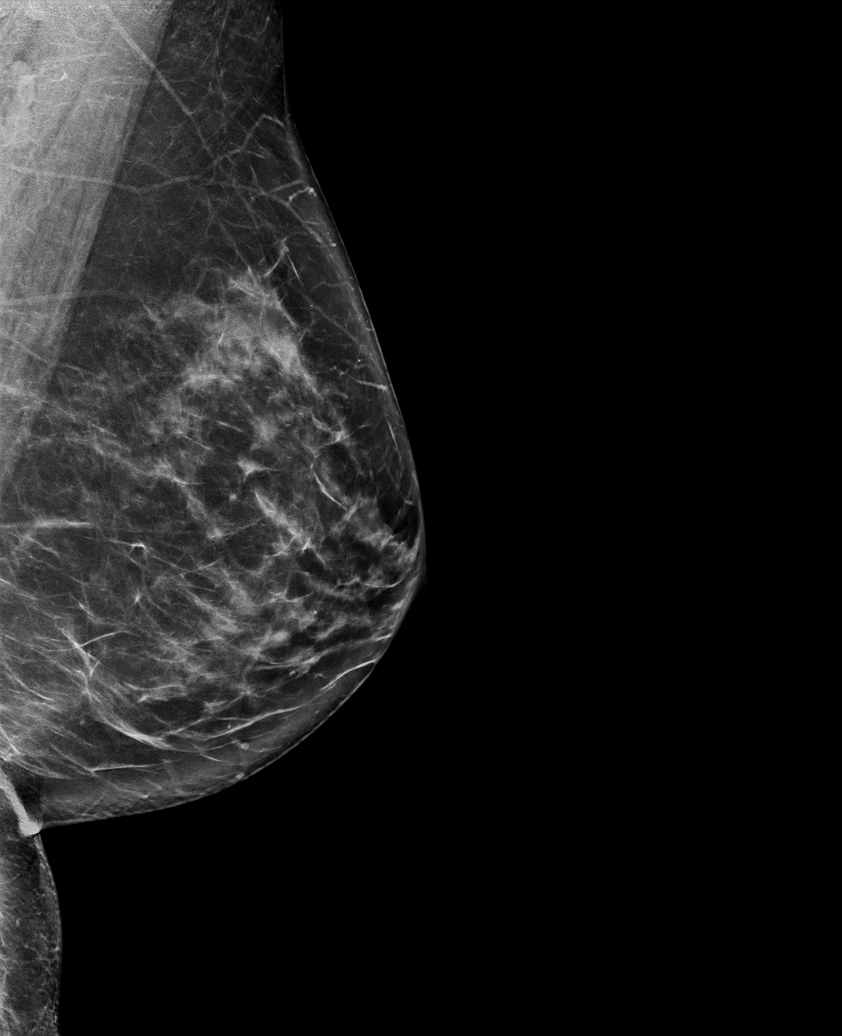

[L MLO synth-2D (2 of 2)]
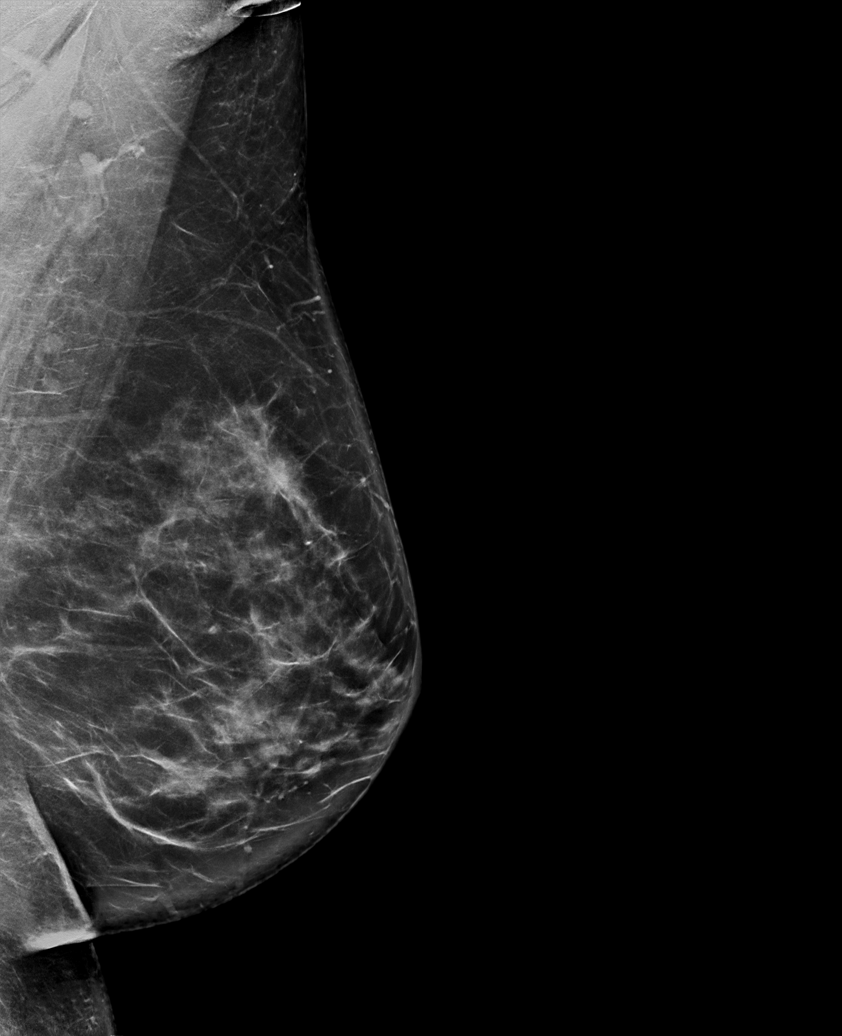

[L MLO tomo · tomo slice 47/92.0]
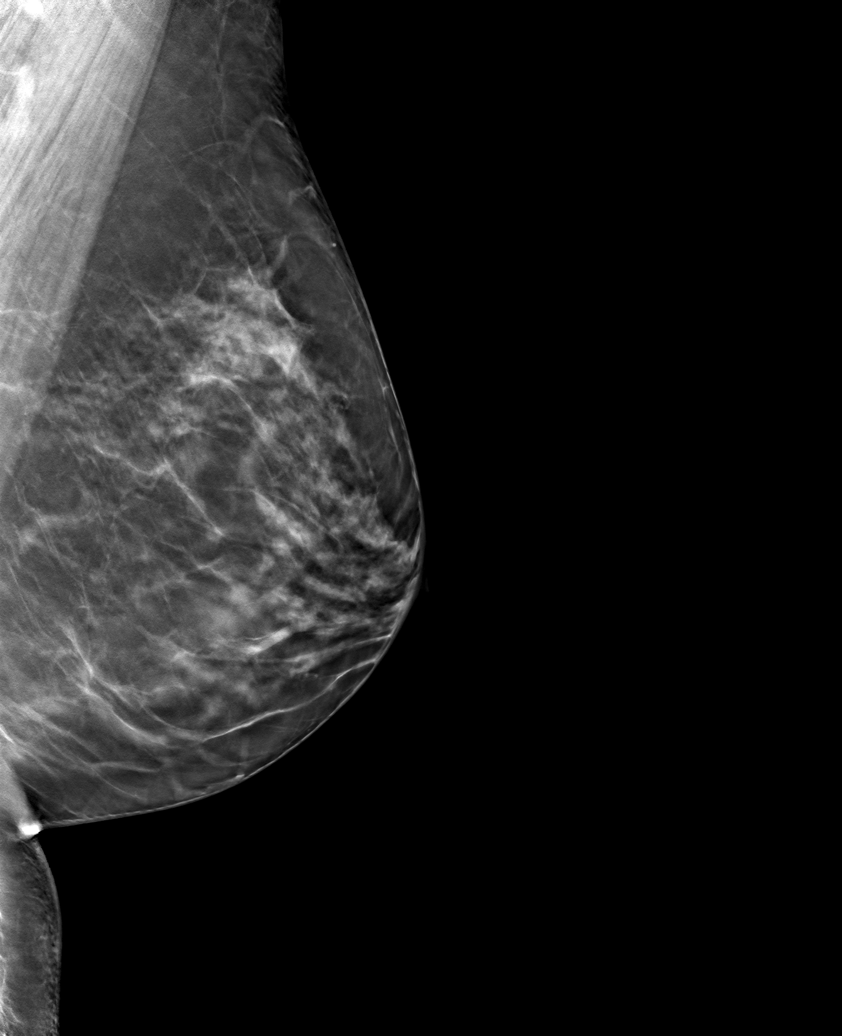

[6 of 30 positions shown; findings below may reference images not displayed]

ACR Breast Density Category b: There are scattered areas of
fibroglandular density.
FINDINGS: There are no findings suspicious for malignancy. The images were
evaluated with computer-aided detection.
IMPRESSION: No mammographic evidence of malignancy. A result letter of this
screening mammogram will be mailed directly to the patient.

RECOMMENDATION:
Screening mammogram in one year. (Code:C7-6-ASJ)

BI-RADS CATEGORY  1: Negative.

## 2021-12-16 ENCOUNTER — Ambulatory Visit: Payer: BC Managed Care – PPO | Admitting: Nurse Practitioner

## 2021-12-16 ENCOUNTER — Other Ambulatory Visit: Payer: Self-pay

## 2021-12-16 ENCOUNTER — Encounter: Payer: Self-pay | Admitting: Nurse Practitioner

## 2021-12-16 VITALS — BP 124/78 | HR 88 | Temp 98.0°F | Resp 18 | Ht 68.0 in | Wt 175.6 lb

## 2021-12-16 DIAGNOSIS — G4733 Obstructive sleep apnea (adult) (pediatric): Secondary | ICD-10-CM | POA: Diagnosis not present

## 2021-12-16 DIAGNOSIS — R519 Headache, unspecified: Secondary | ICD-10-CM

## 2021-12-16 NOTE — Progress Notes (Signed)
BP 124/78   Pulse 88   Temp 98 F (36.7 C) (Oral)   Resp 18   Ht 5' 8"  (1.727 m)   Wt 175 lb 9.6 oz (79.7 kg)   SpO2 98%   BMI 26.70 kg/m    Subjective:    Patient ID: Melissa Harrison, female    DOB: February 19, 1981, 41 y.o.   MRN: [id number]  HPI: Melissa Harrison is a 41 y.o. female  Chief Complaint  Patient presents with   Headache    Persistent headache on left side for 2 weeks   Headache:  She says for the last two weeks she has had a headache on the left side of her head.  She says this is happening every day.  She does have a history of ocular migraines but this is different.  She denies any nausea, no light sensitivity.  She denies any trauma, no fever. She says she recently started a new medication for UC budesonide ER 9 mg.  She says that her headaches started around the same time that she started the medication. She describes the pain as sharp and is not constant.  She says the pain only lasts for a few minutes.  She says she has taken tylenol but does not know if it helped because the pain does not last long.  Denies any other neurological symptoms.  Neuro exam was normal.  We will get CT of head.  Relevant past medical, surgical, family and social history reviewed and updated as indicated. Interim medical history since our last visit reviewed. Allergies and medications reviewed and updated.  Review of Systems  Constitutional: Negative for fever or weight change.  Respiratory: Negative for cough and shortness of breath.   Cardiovascular: Negative for chest pain or palpitations.  Gastrointestinal: Negative for abdominal pain, no bowel changes.  Musculoskeletal: Negative for gait problem or joint swelling.  Skin: Negative for rash.  Neurological: Negative for dizziness, positive for headache.  No other specific complaints in a complete review of systems (except as listed in HPI above).      Objective:    BP 124/78   Pulse 88   Temp 98 F (36.7 C) (Oral)    Resp 18   Ht 5' 8"  (1.727 m)   Wt 175 lb 9.6 oz (79.7 kg)   SpO2 98%   BMI 26.70 kg/m   Wt Readings from Last 3 Encounters:  12/16/21 175 lb 9.6 oz (79.7 kg)  11/28/21 175 lb 8 oz (79.6 kg)  05/28/21 185 lb 8 oz (84.1 kg)    Physical Exam  Constitutional: Patient appears well-developed and well-nourished.  No distress.  HEENT: head atraumatic, normocephalic, pupils equal and reactive to light,  neck supple Cardiovascular: Normal rate, regular rhythm and normal heart sounds.  No murmur heard. No BLE edema. Pulmonary/Chest: Effort normal and breath sounds normal. No respiratory distress. Abdominal: Soft.  There is no tenderness. Neuro: cranial nerves intact, equal grip, gait normal Psychiatric: Patient has a normal mood and affect. behavior is normal. Judgment and thought content normal.  Results for orders placed or performed in visit on 11/28/21  CBC with Differential/Platelet  Result Value Ref Range   WBC 6.8 3.8 - 10.8 Thousand/uL   RBC 4.45 3.80 - 5.10 Million/uL   Hemoglobin 13.3 11.7 - 15.5 g/dL   HCT 40.0 35.0 - 45.0 %   MCV 89.9 80.0 - 100.0 fL   MCH 29.9 27.0 - 33.0 pg   MCHC 33.3 32.0 - 36.0  g/dL   RDW 11.9 11.0 - 15.0 %   Platelets 295 140 - 400 Thousand/uL   MPV 10.0 7.5 - 12.5 fL   Neutro Abs 4,502 1,500 - 7,800 cells/uL   Lymphs Abs 1,244 850 - 3,900 cells/uL   Absolute Monocytes 456 200 - 950 cells/uL   Eosinophils Absolute 530 (H) 15 - 500 cells/uL   Basophils Absolute 68 0 - 200 cells/uL   Neutrophils Relative % 66.2 %   Total Lymphocyte 18.3 %   Monocytes Relative 6.7 %   Eosinophils Relative 7.8 %   Basophils Relative 1.0 %  COMPLETE METABOLIC PANEL WITH GFR  Result Value Ref Range   Glucose, Bld 90 65 - 99 mg/dL   BUN 12 7 - 25 mg/dL   Creat 0.66 0.50 - 0.99 mg/dL   eGFR 113 > OR = 60 mL/min/1.83m   BUN/Creatinine Ratio SEE NOTE: 6 - 22 (calc)   Sodium 138 135 - 146 mmol/L   Potassium 4.3 3.5 - 5.3 mmol/L   Chloride 103 98 - 110 mmol/L   CO2 24  20 - 32 mmol/L   Calcium 9.3 8.6 - 10.2 mg/dL   Total Protein 6.9 6.1 - 8.1 g/dL   Albumin 4.1 3.6 - 5.1 g/dL   Globulin 2.8 1.9 - 3.7 g/dL (calc)   AG Ratio 1.5 1.0 - 2.5 (calc)   Total Bilirubin 0.3 0.2 - 1.2 mg/dL   Alkaline phosphatase (APISO) 49 31 - 125 U/L   AST 13 10 - 30 U/L   ALT 15 6 - 29 U/L  Hemoglobin A1c  Result Value Ref Range   Hgb A1c MFr Bld 5.3 <5.7 % of total Hgb   Mean Plasma Glucose 105 mg/dL   eAG (mmol/L) 5.8 mmol/L  TSH  Result Value Ref Range   TSH 1.53 mIU/L      Assessment & Plan:   Problem List Items Addressed This Visit       Other   Headache - Primary    Getting head CT.  Can continue to take Tylenol for pain.  Discussed reasons to seek emergency care.         Follow up plan: Return if symptoms worsen or fail to improve.

## 2021-12-16 NOTE — Assessment & Plan Note (Signed)
Getting head CT.  Can continue to take Tylenol for pain.  Discussed reasons to seek emergency care.

## 2021-12-19 DIAGNOSIS — H903 Sensorineural hearing loss, bilateral: Secondary | ICD-10-CM | POA: Diagnosis not present

## 2021-12-19 DIAGNOSIS — G4733 Obstructive sleep apnea (adult) (pediatric): Secondary | ICD-10-CM | POA: Diagnosis not present

## 2021-12-26 DIAGNOSIS — K5190 Ulcerative colitis, unspecified, without complications: Secondary | ICD-10-CM | POA: Diagnosis not present

## 2021-12-27 ENCOUNTER — Other Ambulatory Visit
Admission: RE | Admit: 2021-12-27 | Discharge: 2021-12-27 | Disposition: A | Discharge status: Home / Self Care | Payer: BC Managed Care – PPO | Source: Ambulatory Visit | Attending: Gastroenterology | Admitting: Gastroenterology

## 2021-12-27 DIAGNOSIS — R197 Diarrhea, unspecified: Secondary | ICD-10-CM | POA: Insufficient documentation

## 2021-12-27 LAB — C DIFFICILE QUICK SCREEN W PCR REFLEX
C Diff antigen: POSITIVE — AB
C Diff toxin: NEGATIVE

## 2021-12-27 LAB — GASTROINTESTINAL PANEL BY PCR, STOOL (REPLACES STOOL CULTURE)

## 2021-12-27 LAB — CLOSTRIDIUM DIFFICILE BY PCR, REFLEXED: Toxigenic C. Difficile by PCR: POSITIVE — AB

## 2021-12-29 ENCOUNTER — Ambulatory Visit
Admission: RE | Admit: 2021-12-29 | Discharge: 2021-12-29 | Disposition: A | Discharge status: Home / Self Care | Payer: BC Managed Care – PPO | Source: Ambulatory Visit | Attending: Nurse Practitioner | Admitting: Nurse Practitioner

## 2021-12-29 DIAGNOSIS — R519 Headache, unspecified: Secondary | ICD-10-CM | POA: Diagnosis not present

## 2021-12-30 ENCOUNTER — Other Ambulatory Visit: Payer: Self-pay | Admitting: Nurse Practitioner

## 2021-12-30 ENCOUNTER — Encounter: Payer: Self-pay | Admitting: Nurse Practitioner

## 2021-12-30 DIAGNOSIS — R519 Headache, unspecified: Secondary | ICD-10-CM

## 2022-01-15 DIAGNOSIS — K51311 Ulcerative (chronic) rectosigmoiditis with rectal bleeding: Secondary | ICD-10-CM | POA: Diagnosis not present

## 2022-01-16 DIAGNOSIS — G4733 Obstructive sleep apnea (adult) (pediatric): Secondary | ICD-10-CM | POA: Diagnosis not present

## 2022-01-26 DIAGNOSIS — M542 Cervicalgia: Secondary | ICD-10-CM | POA: Diagnosis not present

## 2022-01-26 DIAGNOSIS — R42 Dizziness and giddiness: Secondary | ICD-10-CM | POA: Diagnosis not present

## 2022-01-26 DIAGNOSIS — G43109 Migraine with aura, not intractable, without status migrainosus: Secondary | ICD-10-CM | POA: Diagnosis not present

## 2022-01-26 DIAGNOSIS — M5481 Occipital neuralgia: Secondary | ICD-10-CM | POA: Diagnosis not present

## 2022-02-02 DIAGNOSIS — K5190 Ulcerative colitis, unspecified, without complications: Secondary | ICD-10-CM | POA: Diagnosis not present

## 2022-02-04 ENCOUNTER — Other Ambulatory Visit: Payer: Self-pay | Admitting: Physician Assistant

## 2022-02-04 DIAGNOSIS — G379 Demyelinating disease of central nervous system, unspecified: Secondary | ICD-10-CM

## 2022-02-04 DIAGNOSIS — R42 Dizziness and giddiness: Secondary | ICD-10-CM

## 2022-02-04 DIAGNOSIS — G43109 Migraine with aura, not intractable, without status migrainosus: Secondary | ICD-10-CM

## 2022-02-04 DIAGNOSIS — M542 Cervicalgia: Secondary | ICD-10-CM

## 2022-02-15 DIAGNOSIS — G4733 Obstructive sleep apnea (adult) (pediatric): Secondary | ICD-10-CM | POA: Diagnosis not present

## 2022-02-16 ENCOUNTER — Ambulatory Visit
Admission: RE | Admit: 2022-02-16 | Discharge: 2022-02-16 | Disposition: A | Discharge status: Home / Self Care | Payer: BC Managed Care – PPO | Source: Ambulatory Visit | Attending: Physician Assistant | Admitting: Physician Assistant

## 2022-02-16 DIAGNOSIS — R42 Dizziness and giddiness: Secondary | ICD-10-CM

## 2022-02-16 DIAGNOSIS — G379 Demyelinating disease of central nervous system, unspecified: Secondary | ICD-10-CM | POA: Diagnosis not present

## 2022-02-16 DIAGNOSIS — G43109 Migraine with aura, not intractable, without status migrainosus: Secondary | ICD-10-CM | POA: Insufficient documentation

## 2022-02-16 DIAGNOSIS — M542 Cervicalgia: Secondary | ICD-10-CM | POA: Insufficient documentation

## 2022-02-16 DIAGNOSIS — M50222 Other cervical disc displacement at C5-C6 level: Secondary | ICD-10-CM | POA: Diagnosis not present

## 2022-02-16 DIAGNOSIS — M4802 Spinal stenosis, cervical region: Secondary | ICD-10-CM | POA: Diagnosis not present

## 2022-02-16 DIAGNOSIS — R519 Headache, unspecified: Secondary | ICD-10-CM | POA: Diagnosis not present

## 2022-02-16 DIAGNOSIS — G43909 Migraine, unspecified, not intractable, without status migrainosus: Secondary | ICD-10-CM | POA: Diagnosis not present

## 2022-02-25 DIAGNOSIS — R197 Diarrhea, unspecified: Secondary | ICD-10-CM | POA: Diagnosis not present

## 2022-02-25 DIAGNOSIS — R1111 Vomiting without nausea: Secondary | ICD-10-CM | POA: Diagnosis not present

## 2022-02-25 DIAGNOSIS — R1013 Epigastric pain: Secondary | ICD-10-CM | POA: Diagnosis not present

## 2022-02-25 DIAGNOSIS — K5190 Ulcerative colitis, unspecified, without complications: Secondary | ICD-10-CM | POA: Diagnosis not present

## 2022-02-26 ENCOUNTER — Ambulatory Visit: Payer: BC Managed Care – PPO

## 2022-02-26 ENCOUNTER — Other Ambulatory Visit: Payer: Self-pay | Admitting: Gastroenterology

## 2022-02-26 DIAGNOSIS — R110 Nausea: Secondary | ICD-10-CM | POA: Diagnosis not present

## 2022-02-26 DIAGNOSIS — R1013 Epigastric pain: Secondary | ICD-10-CM | POA: Diagnosis not present

## 2022-02-26 DIAGNOSIS — R1030 Lower abdominal pain, unspecified: Secondary | ICD-10-CM | POA: Diagnosis not present

## 2022-02-26 DIAGNOSIS — R1111 Vomiting without nausea: Secondary | ICD-10-CM | POA: Diagnosis not present

## 2022-02-26 DIAGNOSIS — K625 Hemorrhage of anus and rectum: Secondary | ICD-10-CM | POA: Diagnosis not present

## 2022-02-26 DIAGNOSIS — K5190 Ulcerative colitis, unspecified, without complications: Secondary | ICD-10-CM | POA: Diagnosis not present

## 2022-02-26 DIAGNOSIS — R109 Unspecified abdominal pain: Secondary | ICD-10-CM

## 2022-02-26 DIAGNOSIS — R197 Diarrhea, unspecified: Secondary | ICD-10-CM | POA: Diagnosis not present

## 2022-02-26 DIAGNOSIS — R509 Fever, unspecified: Secondary | ICD-10-CM

## 2022-02-27 DIAGNOSIS — K5190 Ulcerative colitis, unspecified, without complications: Secondary | ICD-10-CM | POA: Diagnosis not present

## 2022-03-03 ENCOUNTER — Ambulatory Visit: Payer: BC Managed Care – PPO

## 2022-03-12 ENCOUNTER — Other Ambulatory Visit: Payer: Self-pay | Admitting: Nurse Practitioner

## 2022-03-12 ENCOUNTER — Encounter: Payer: Self-pay | Admitting: Nurse Practitioner

## 2022-03-12 DIAGNOSIS — Z3041 Encounter for surveillance of contraceptive pills: Secondary | ICD-10-CM

## 2022-03-12 MED ORDER — DESOGESTREL-ETHINYL ESTRADIOL 0.15-0.02/0.01 MG (21/5) PO TABS: 1.0000 | ORAL_TABLET | Freq: Every day | ORAL | 11 refills | Status: DC

## 2022-03-18 DIAGNOSIS — G4733 Obstructive sleep apnea (adult) (pediatric): Secondary | ICD-10-CM | POA: Diagnosis not present

## 2022-03-20 DIAGNOSIS — M7918 Myalgia, other site: Secondary | ICD-10-CM | POA: Diagnosis not present

## 2022-03-20 DIAGNOSIS — M5481 Occipital neuralgia: Secondary | ICD-10-CM | POA: Diagnosis not present

## 2022-04-03 DIAGNOSIS — G43109 Migraine with aura, not intractable, without status migrainosus: Secondary | ICD-10-CM | POA: Diagnosis not present

## 2022-04-03 DIAGNOSIS — K51018 Ulcerative (chronic) pancolitis with other complication: Secondary | ICD-10-CM | POA: Diagnosis not present

## 2022-04-03 DIAGNOSIS — M5481 Occipital neuralgia: Secondary | ICD-10-CM | POA: Diagnosis not present

## 2022-04-03 DIAGNOSIS — R42 Dizziness and giddiness: Secondary | ICD-10-CM | POA: Diagnosis not present

## 2022-04-16 DIAGNOSIS — G4733 Obstructive sleep apnea (adult) (pediatric): Secondary | ICD-10-CM | POA: Diagnosis not present

## 2022-04-18 DIAGNOSIS — G4733 Obstructive sleep apnea (adult) (pediatric): Secondary | ICD-10-CM | POA: Diagnosis not present

## 2022-04-23 DIAGNOSIS — R928 Other abnormal and inconclusive findings on diagnostic imaging of breast: Secondary | ICD-10-CM | POA: Diagnosis not present

## 2022-04-23 DIAGNOSIS — Z803 Family history of malignant neoplasm of breast: Secondary | ICD-10-CM | POA: Diagnosis not present

## 2022-04-23 DIAGNOSIS — N6092 Unspecified benign mammary dysplasia of left breast: Secondary | ICD-10-CM | POA: Diagnosis not present

## 2022-04-23 DIAGNOSIS — N62 Hypertrophy of breast: Secondary | ICD-10-CM | POA: Diagnosis not present

## 2022-04-24 ENCOUNTER — Other Ambulatory Visit: Payer: Self-pay | Admitting: Nurse Practitioner

## 2022-04-24 NOTE — Telephone Encounter (Signed)
Requested Prescriptions  Pending Prescriptions Disp Refills   levothyroxine (SYNTHROID) 112 MCG tablet [Pharmacy Med Name: LEVOTHYROXINE 0.112MG (112MCG) TABS] 45 tablet 0    Sig: TAKE 1 TABLET BY MOUTH EVERY OTHER DAY, ALTERNATING WITH 125 MCG DOSE     Endocrinology:  Hypothyroid Agents Passed - 04/24/2022  9:59 AM      Passed - TSH in normal range and within 360 days    TSH  Date Value Ref Range Status  11/28/2021 1.53 mIU/L Final    Comment:              Reference Range .           > or = 20 Years  0.40-4.50 .                Pregnancy Ranges           First trimester    0.26-2.66           Second trimester   0.55-2.73           Third trimester    0.43-2.91          Passed - Valid encounter within last 12 months    Recent Outpatient Visits           4 months ago Persistent headaches   [place], FNP   4 months ago Generalized anxiety disorder   [place], FNP   11 months ago Generalized anxiety disorder   [place], FNP   1 year ago Lump in neck   [facility] Bo [place], FNP   1 year ago Generalized anxiety disorder   [facility] Bo Mer[place], FNP       Future Appointments             In 4 weeks Steele Sizer, MD [facility], [facility]

## 2022-05-11 DIAGNOSIS — Z803 Family history of malignant neoplasm of breast: Secondary | ICD-10-CM | POA: Diagnosis not present

## 2022-05-11 DIAGNOSIS — D242 Benign neoplasm of left breast: Secondary | ICD-10-CM | POA: Diagnosis not present

## 2022-05-11 DIAGNOSIS — Z1239 Encounter for other screening for malignant neoplasm of breast: Secondary | ICD-10-CM | POA: Diagnosis not present

## 2022-05-12 ENCOUNTER — Other Ambulatory Visit: Payer: Self-pay | Admitting: Family Medicine

## 2022-05-12 DIAGNOSIS — Z8249 Family history of ischemic heart disease and other diseases of the circulatory system: Secondary | ICD-10-CM

## 2022-05-12 DIAGNOSIS — I10 Essential (primary) hypertension: Secondary | ICD-10-CM

## 2022-05-12 DIAGNOSIS — E7800 Pure hypercholesterolemia, unspecified: Secondary | ICD-10-CM

## 2022-05-15 DIAGNOSIS — G4733 Obstructive sleep apnea (adult) (pediatric): Secondary | ICD-10-CM | POA: Diagnosis not present

## 2022-05-17 DIAGNOSIS — G4733 Obstructive sleep apnea (adult) (pediatric): Secondary | ICD-10-CM | POA: Diagnosis not present

## 2022-05-21 NOTE — Progress Notes (Signed)
Name: Melissa Harrison   MRN: [id number]    DOB: Jan 02, 1981   Date:05/22/2022       Progress Note  Subjective  Chief Complaint  Follow Up  HPI  Major depression and GAD: she was on Lexapro but gained a lot of weight, she is now on low dose of zoloft but feels like is not working well, she has short temper, irritability, feeling blue more often, no anhedonia. Discussed increasing dose to 50 mg and add wellbutrin to offset weight gain.   Atypical lobular left breast: she needs to come off estrogen, does not like having cycles due to dysmenorrhea and menorrhagia, she needs a referral to have IUD placed  Ulcerative colitis: she had a fecal transplant in January due to c. Difi infection that was not resolving, doing well since, no blood stools or diarrhea. Getting infusion every 8 weeks. She has a history of vitamin D, B12 and iron deficiency and we will recheck labs   Asthma mild intermittent/AR: seen by allergist and was given Breo but does not use it , she also has nasal congestion, rhinorrhea, currently not taking any medications, discussed otc antihistamines   Ocular migraines: episodes about twice per month, only lasts one hour, cannot read or see any screens but resolves by itself.   Patient Active Problem List   Diagnosis Date Noted   Atypical lobular hyperplasia [facility]) of left breast 11/28/2021   Closed fracture of fifth metatarsal bone 01/26/2021   Major depression in remission ([place]) 11/07/2020   Bilateral hip pain 11/07/2020   Headache 04/30/2020   Episode of dizziness 04/30/2020   Peripheral vasodilation (HCC) 04/03/2019   Ulcerative colitis ([place]) 02/22/2019   CRP elevated 04/27/2017   Joint stiffness of knee, unspecified laterality [id number]   Cyclic citrullinated peptide (CCP) antibody positive 04/06/2017   Generalized anxiety disorder 10/26/2016   Sleep apnea 12/24/2015   Hypothyroidism due to Hashimoto's thyroiditis 09/07/2015   Chronically dry eyes 09/06/2015    Vitamin D deficiency 08/14/2015   Anemia, iron deficiency 08/14/2015    Past Surgical History:  Procedure Laterality Date   BREAST BIOPSY Left 09/30/2021   [place] bx/ path pending   CESAREAN SECTION      Family History  Problem Relation Age of Onset   Thyroid disease Mother    Cancer Father        LUNG   Diabetes Sister    Eczema Daughter    Asthma Daughter    Cancer Maternal Aunt        UTERINE   Heart attack Maternal Grandfather    Breast cancer Paternal Grandmother    Cancer Paternal Grandmother        breast cancer   Cancer Paternal Grandfather        lung   Diabetes Brother    Heart disease Brother        from his diabetes, heart attack   Hypertension Brother    Kidney disease Brother     Social History   Tobacco Use   Smoking status: Former   Smokeless tobacco: Never  Substance Use Topics   Alcohol use: Yes    Alcohol/week: 0.0 standard drinks of alcohol    Comment: an ocassional glass of wine     Current Outpatient Medications:    BREO ELLIPTA 200-25 MCG/ACT AEPB, 1 puff daily., Disp: , Rfl:    buPROPion (WELLBUTRIN XL) 150 MG 24 hr tablet, Take 1 tablet (150 mg total) by mouth daily., Disp: 90 tablet, Rfl: 0  desogestrel-ethinyl estradiol (VIORELE) 0.15-0.02/0.01 MG (21/5) tablet, Take 1 tablet by mouth daily. does not take placebo pill, takes straight through, Disp: 84 tablet, Rfl: 11   diazepam (VALIUM) 2 MG tablet, Take 1 tablet (2 mg total) by mouth every 6 (six) hours as needed for anxiety., Disp: 4 tablet, Rfl: 0   ENTYVIO 300 MG injection, Inject into the vein., Disp: , Rfl:    levothyroxine (SYNTHROID) 112 MCG tablet, TAKE 1 TABLET BY MOUTH EVERY OTHER DAY, ALTERNATING WITH 125 MCG DOSE, Disp: 45 tablet, Rfl: 0   levothyroxine (SYNTHROID) 125 MCG tablet, TAKE 1 TABLET BY MOUTH EVERY OTHER DAY, ALTERNATE WITH 112 MCG DOSE, Disp: 135 tablet, Rfl: 3   RESTASIS 0.05 % ophthalmic emulsion, , Disp: , Rfl:    sertraline (ZOLOFT) 50 MG tablet, Take 1 tablet  (50 mg total) by mouth daily., Disp: 90 tablet, Rfl: 1  No Known Allergies  I personally reviewed active problem list, medication list, allergies, family history, social history, health maintenance with the patient/caregiver today.   ROS  Ten systems reviewed and is negative except as mentioned in HPI   Objective  Vitals:   05/22/22 0944  BP: 120/74  Pulse: 93  Resp: 16  Temp: 97.7 F (36.5 C)  TempSrc: Oral  SpO2: 99%  Weight: 173 lb 8 oz (78.7 kg)  Height: 5' 7.5" (1.715 m)    Body mass index is 26.77 kg/m.  Physical Exam  Constitutional: Patient appears well-developed and well-nourished.  No distress.  HEENT: head atraumatic, normocephalic, pupils equal and reactive to light, neck supple Cardiovascular: Normal rate, regular rhythm and normal heart sounds.  No murmur heard. No BLE edema. Pulmonary/Chest: Effort normal and breath sounds normal. No respiratory distress. Abdominal: Soft.  There is no tenderness. Psychiatric: Patient has a normal mood and affect. behavior is normal. Judgment and thought content normal.   PHQ2/9:    05/22/2022    9:48 AM 12/16/2021    2:45 PM 11/28/2021    9:07 AM 05/28/2021    9:30 AM 03/21/2021   10:43 AM  Depression screen PHQ 2/9  Decreased Interest 0 0 0 0 0  Down, Depressed, Hopeless 1 0 0 0 0  PHQ - 2 Score 1 0 0 0 0  Altered sleeping 0 0 0 0 0  Tired, decreased energy 1 0 0 0 0  Change in appetite 0 0 0 0 0  Feeling bad or failure about yourself  0 0 0 0 0  Trouble concentrating 0 0 0 0 0  Moving slowly or fidgety/restless 0 0 0 0 0  Suicidal thoughts 0 0 0 0 0  PHQ-9 Score 2 0 0 0 0  Difficult doing work/chores Not difficult at all Not difficult at all Not difficult at all Not difficult at all Not difficult at all    phq 9 is negative   Fall Risk:    05/22/2022    9:48 AM 12/16/2021    2:45 PM 11/28/2021    9:06 AM 05/28/2021    9:31 AM 03/21/2021   10:43 AM  Fall Risk   Falls in the past year? 0 0 0 0 0  Number  falls in past yr:  0 0 0 0  Injury with Fall?  0 0 0 0  Risk for fall due to : No Fall Risks      Follow up Falls prevention discussed Falls evaluation completed Falls evaluation completed Falls evaluation completed Falls evaluation completed     Assessment &  Plan  1. General counselling and advice on contraception  - Ambulatory referral to Obstetrics / Gynecology  2. Mild major depression (HCC)  - sertraline (ZOLOFT) 50 MG tablet; Take 1 tablet (50 mg total) by mouth daily.  Dispense: 90 tablet; Refill: 1 - buPROPion (WELLBUTRIN XL) 150 MG 24 hr tablet; Take 1 tablet (150 mg total) by mouth daily.  Dispense: 90 tablet; Refill: 0 - COMPLETE METABOLIC PANEL WITH GFR  3. Atypical lobular hyperplasia (ALH) of left breast   4 Well controlled mild persistent asthma   5. Generalized anxiety disorder  - sertraline (ZOLOFT) 50 MG tablet; Take 1 tablet (50 mg total) by mouth daily.  Dispense: 90 tablet; Refill: 1  6. History of iron deficiency anemia  - Iron, TIBC and Ferritin Panel - CBC with Differential/Platelet  7. Hypothyroidism due to Hashimoto's thyroiditis  TSH  8. Ocular migraine   9. B12 deficiency  - B12 and Folate Panel  10. Vitamin D deficiency  - VITAMIN D 25 Hydroxy (Vit-D Deficiency, Fractures)  11. Lipid screening  - Lipid panel  12. Need for pneumococcal 20-valent conjugate vaccination  - Pneumococcal conjugate vaccine 20-valent (Prevnar 20)

## 2022-05-22 ENCOUNTER — Ambulatory Visit: Payer: BC Managed Care – PPO | Admitting: Family Medicine

## 2022-05-22 ENCOUNTER — Encounter: Payer: Self-pay | Admitting: Family Medicine

## 2022-05-22 VITALS — BP 120/74 | HR 93 | Temp 97.7°F | Resp 16 | Ht 67.5 in | Wt 173.5 lb

## 2022-05-22 DIAGNOSIS — F411 Generalized anxiety disorder: Secondary | ICD-10-CM

## 2022-05-22 DIAGNOSIS — Z23 Encounter for immunization: Secondary | ICD-10-CM | POA: Diagnosis not present

## 2022-05-22 DIAGNOSIS — E038 Other specified hypothyroidism: Secondary | ICD-10-CM

## 2022-05-22 DIAGNOSIS — E559 Vitamin D deficiency, unspecified: Secondary | ICD-10-CM | POA: Diagnosis not present

## 2022-05-22 DIAGNOSIS — E538 Deficiency of other specified B group vitamins: Secondary | ICD-10-CM

## 2022-05-22 DIAGNOSIS — Z13220 Encounter for screening for lipoid disorders: Secondary | ICD-10-CM | POA: Diagnosis not present

## 2022-05-22 DIAGNOSIS — J4530 Mild persistent asthma, uncomplicated: Secondary | ICD-10-CM | POA: Diagnosis not present

## 2022-05-22 DIAGNOSIS — N6092 Unspecified benign mammary dysplasia of left breast: Secondary | ICD-10-CM

## 2022-05-22 DIAGNOSIS — G43109 Migraine with aura, not intractable, without status migrainosus: Secondary | ICD-10-CM

## 2022-05-22 DIAGNOSIS — E063 Autoimmune thyroiditis: Secondary | ICD-10-CM

## 2022-05-22 DIAGNOSIS — Z3009 Encounter for other general counseling and advice on contraception: Secondary | ICD-10-CM | POA: Diagnosis not present

## 2022-05-22 DIAGNOSIS — Z862 Personal history of diseases of the blood and blood-forming organs and certain disorders involving the immune mechanism: Secondary | ICD-10-CM | POA: Diagnosis not present

## 2022-05-22 DIAGNOSIS — F320 Major depressive disorder, single episode, mild: Secondary | ICD-10-CM

## 2022-05-22 MED ORDER — SERTRALINE HCL 50 MG PO TABS: 50.0000 mg | ORAL_TABLET | Freq: Every day | ORAL | 1 refills | Status: DC

## 2022-05-22 MED ORDER — BUPROPION HCL ER (XL) 150 MG PO TB24: 150.0000 mg | ORAL_TABLET | Freq: Every day | ORAL | 0 refills | Status: DC

## 2022-05-23 LAB — COMPLETE METABOLIC PANEL WITH GFR
AG Ratio: 1.4 (calc) (ref 1.0–2.5)
ALT: 24 U/L (ref 6–29)
AST: 19 U/L (ref 10–30)
Albumin: 4.2 g/dL (ref 3.6–5.1)
Alkaline phosphatase (APISO): 56 U/L (ref 31–125)
BUN: 14 mg/dL (ref 7–25)
CO2: 26 mmol/L (ref 20–32)
Calcium: 9.2 mg/dL (ref 8.6–10.2)
Chloride: 105 mmol/L (ref 98–110)
Creat: 0.61 mg/dL (ref 0.50–0.99)
Globulin: 2.9 g/dL (calc) (ref 1.9–3.7)
Glucose, Bld: 91 mg/dL (ref 65–99)
Potassium: 4.3 mmol/L (ref 3.5–5.3)
Sodium: 139 mmol/L (ref 135–146)
Total Bilirubin: 0.3 mg/dL (ref 0.2–1.2)
Total Protein: 7.1 g/dL (ref 6.1–8.1)
eGFR: 114 mL/min/{1.73_m2} (ref >=60)

## 2022-05-23 LAB — CBC WITH DIFFERENTIAL/PLATELET
Absolute Monocytes: 496 cells/uL (ref 200–950)
Basophils Absolute: 80 cells/uL (ref 0–200)
Basophils Relative: 1.1 %
Eosinophils Absolute: 584 cells/uL — ABNORMAL HIGH (ref 15–500)
Eosinophils Relative: 8 %
HCT: 37.3 % (ref 35.0–45.0)
Hemoglobin: 12.3 g/dL (ref 11.7–15.5)
Lymphs Abs: 1628 cells/uL (ref 850–3900)
MCH: 28.6 pg (ref 27.0–33.0)
MCHC: 33 g/dL (ref 32.0–36.0)
MCV: 86.7 fL (ref 80.0–100.0)
MPV: 10.2 fL (ref 7.5–12.5)
Monocytes Relative: 6.8 %
Neutro Abs: 4511 cells/uL (ref 1500–7800)
Neutrophils Relative %: 61.8 %
Platelets: 299 10*3/uL (ref 140–400)
RBC: 4.3 10*6/uL (ref 3.80–5.10)
RDW: 11.9 % (ref 11.0–15.0)
Total Lymphocyte: 22.3 %
WBC: 7.3 10*3/uL (ref 3.8–10.8)

## 2022-05-23 LAB — LIPID PANEL
Cholesterol: 171 mg/dL (ref <200)
HDL: 58 mg/dL (ref >=50)
LDL Cholesterol (Calc): 91 mg/dL (calc)
Non-HDL Cholesterol (Calc): 113 mg/dL (calc) (ref <130)
Total CHOL/HDL Ratio: 2.9 (calc) (ref <5.0)
Triglycerides: 121 mg/dL (ref <150)

## 2022-05-23 LAB — IRON,TIBC AND FERRITIN PANEL
%SAT: 20 % (calc) (ref 16–45)
Ferritin: 14 ng/mL — ABNORMAL LOW (ref 16–232)
Iron: 83 ug/dL (ref 40–190)
TIBC: 410 mcg/dL (calc) (ref 250–450)

## 2022-05-23 LAB — B12 AND FOLATE PANEL
Folate: 14.3 ng/mL
Vitamin B-12: 202 pg/mL (ref 200–1100)

## 2022-05-23 LAB — TSH: TSH: 0.87 mIU/L

## 2022-05-23 LAB — VITAMIN D 25 HYDROXY (VIT D DEFICIENCY, FRACTURES): Vit D, 25-Hydroxy: 43 ng/mL (ref 30–100)

## 2022-05-25 ENCOUNTER — Encounter: Payer: Self-pay | Admitting: Family Medicine

## 2022-05-29 DIAGNOSIS — K5100 Ulcerative (chronic) pancolitis without complications: Secondary | ICD-10-CM | POA: Diagnosis not present

## 2022-06-07 ENCOUNTER — Encounter: Payer: Self-pay | Admitting: Nurse Practitioner

## 2022-06-07 ENCOUNTER — Other Ambulatory Visit: Payer: Self-pay | Admitting: Nurse Practitioner

## 2022-06-07 DIAGNOSIS — F411 Generalized anxiety disorder: Secondary | ICD-10-CM

## 2022-06-07 DIAGNOSIS — F325 Major depressive disorder, single episode, in full remission: Secondary | ICD-10-CM

## 2022-06-08 ENCOUNTER — Other Ambulatory Visit: Payer: Self-pay | Admitting: Nurse Practitioner

## 2022-06-08 DIAGNOSIS — F320 Major depressive disorder, single episode, mild: Secondary | ICD-10-CM

## 2022-06-08 MED ORDER — BUPROPION HCL 75 MG PO TABS: 75.0000 mg | ORAL_TABLET | Freq: Every day | ORAL | 0 refills | Status: DC

## 2022-06-08 NOTE — Telephone Encounter (Signed)
Requested Prescriptions  Refused Prescriptions Disp Refills   sertraline (ZOLOFT) 25 MG tablet [Pharmacy Med Name: SERTRALINE 25MG  TABLETS] 90 tablet 1    Sig: TAKE 1 TABLET(25 MG) BY MOUTH DAILY     Psychiatry:  Antidepressants - SSRI - sertraline Passed - 06/07/2022 11:24 AM      Passed - AST in normal range and within 360 days    AST  Date Value Ref Range Status  05/22/2022 19 10 - 30 U/L Final         Passed - ALT in normal range and within 360 days    ALT  Date Value Ref Range Status  05/22/2022 24 6 - 29 U/L Final         Passed - Completed PHQ-2 or PHQ-9 in the last 360 days      Passed - Valid encounter within last 6 months    Recent Outpatient Visits           2 weeks ago Mild major depression [facility])   [facility] Steele Sizer, MD   5 months ago Persistent headaches   [place], FNP   6 months ago Generalized anxiety disorder   [facility] Bo Merino, FNP   1 year ago Generalized anxiety disorder   [facility] Bo Merino, FNP   1 year ago Lump in neck   [facility] Bo Merino, FNP       Future Appointments             In 5 months Reece Packer, Myna Hidalgo, [facility], [facility]

## 2022-06-15 DIAGNOSIS — G4733 Obstructive sleep apnea (adult) (pediatric): Secondary | ICD-10-CM | POA: Diagnosis not present

## 2022-06-17 DIAGNOSIS — G4733 Obstructive sleep apnea (adult) (pediatric): Secondary | ICD-10-CM | POA: Diagnosis not present

## 2022-06-21 ENCOUNTER — Encounter: Payer: Self-pay | Admitting: Obstetrics and Gynecology

## 2022-06-21 NOTE — Progress Notes (Unsigned)
Melissa Salines, FNP   No chief complaint on file.   HPI:      Melissa Harrison is a 42 y.o. G1P0 whose LMP was No LMP recorded. (Menstrual status: Oral contraceptives)., presents today for NP> 3 yrs [facility] consult, referred by PCP [place] on OCPs Last pap 10/28/18 was neg/neg HPV DNA.    Patient Active Problem List   Diagnosis Date Noted   Atypical lobular hyperplasia (ALH) of left breast 11/28/2021   Closed fracture of fifth metatarsal bone 01/26/2021   Major depression in remission 11/07/2020   Bilateral hip pain 11/07/2020   Headache 04/30/2020   Episode of dizziness 04/30/2020   Peripheral vasodilation 04/03/2019   Ulcerative colitis 02/22/2019   CRP elevated 04/27/2017   Joint stiffness of knee, unspecified laterality 04/27/2017   Cyclic citrullinated peptide (CCP) antibody positive 04/06/2017   Generalized anxiety disorder 10/26/2016   Sleep apnea 12/24/2015   Hypothyroidism due to Hashimoto's thyroiditis 09/07/2015   Chronically dry eyes 09/06/2015   Vitamin D deficiency 08/14/2015   Anemia, iron deficiency 08/14/2015    Past Surgical History:  Procedure Laterality Date   BREAST BIOPSY Left 09/30/2021   [place] bx/ path pending   CESAREAN SECTION      Family History  Problem Relation Age of Onset   Thyroid disease Mother    Cancer Father        LUNG   Diabetes Sister    Eczema Daughter    Asthma Daughter    Cancer Maternal Aunt        UTERINE   Heart attack Maternal Grandfather    Breast cancer Paternal Grandmother    Cancer Paternal Grandmother        breast cancer   Cancer Paternal Grandfather        lung   Diabetes Brother    Heart disease Brother        from his diabetes, heart attack   Hypertension Brother    Kidney disease Brother     Social History   Socioeconomic History   Marital status: Married    Spouse name: Not on file   Number of children: 1   Years of education: 16   Highest education level: Not on file  Occupational  History   Occupation: Investment banker, corporate    Comment: [employer]  Tobacco Use   Smoking status: Former   Smokeless tobacco: Never  Building services engineer Use: Never used  Substance and Sexual Activity   Alcohol use: Yes    Alcohol/week: 0.0 standard drinks of alcohol    Comment: an ocassional glass of wine   Drug use: No   Sexual activity: Yes    Partners: Male    Birth control/protection: None, Pill  Other Topics Concern   Not on file  Social History Narrative   Pt lives in 1 story home with her husband and daughter   Has bachelor's degree   Works as Investment banker, corporate.    Social Determinants of Health   Financial Resource Strain: Low Risk  (04/30/2020)   Overall Financial Resource Strain (CARDIA)    Difficulty of Paying Living Expenses: Not hard at all  Food Insecurity: No Food Insecurity (04/30/2020)   Hunger Vital Sign    Worried About Running Out of Food in the Last Year: Never true    Ran Out of Food in the Last Year: Never true  Transportation Needs: No Transportation Needs (04/30/2020)   PRAPARE - Transportation  Lack of Transportation (Medical): No    Lack of Transportation (Non-Medical): No  Physical Activity: Sufficiently Active (04/30/2020)   Exercise Vital Sign    Days of Exercise per Week: 5 days    Minutes of Exercise per Session: 30 min  Stress: Stress Concern Present (04/30/2020)   [employer] of Occupational Health - Occupational Stress Questionnaire    Feeling of Stress : To some extent  Social Connections: Moderately Isolated (04/30/2020)   Social Connection and Isolation Panel [NHANES]    Frequency of Communication with Friends and Family: Three times a week    Frequency of Social Gatherings with Friends and Family: Once a week    Attends Religious Services: Never    Database administrator or Organizations: No    Attends Banker Meetings: Never    Marital Status: Married  Catering manager Violence: Not At Risk (04/30/2020)   Humiliation,  Afraid, Rape, and Kick questionnaire    Fear of Current or Ex-Partner: No    Emotionally Abused: No    Physically Abused: No    Sexually Abused: No    Outpatient Medications Prior to Visit  Medication Sig Dispense Refill   BREO ELLIPTA 200-25 MCG/ACT AEPB 1 puff daily.     buPROPion (WELLBUTRIN) 75 MG tablet Take 1 tablet (75 mg total) by mouth daily. 30 tablet 0   desogestrel-ethinyl estradiol (VIORELE) 0.15-0.02/0.01 MG (21/5) tablet Take 1 tablet by mouth daily. does not take placebo pill, takes straight through 84 tablet 11   diazepam (VALIUM) 2 MG tablet Take 1 tablet (2 mg total) by mouth every 6 (six) hours as needed for anxiety. 4 tablet 0   ENTYVIO 300 MG injection Inject into the vein.     levothyroxine (SYNTHROID) 112 MCG tablet TAKE 1 TABLET BY MOUTH EVERY OTHER DAY, ALTERNATING WITH 125 MCG DOSE 45 tablet 0   levothyroxine (SYNTHROID) 125 MCG tablet TAKE 1 TABLET BY MOUTH EVERY OTHER DAY, ALTERNATE WITH 112 MCG DOSE 135 tablet 3   RESTASIS 0.05 % ophthalmic emulsion      sertraline (ZOLOFT) 50 MG tablet Take 1 tablet (50 mg total) by mouth daily. 90 tablet 1   No facility-administered medications prior to visit.      ROS:  Review of Systems BREAST: No symptoms   OBJECTIVE:   Vitals:  There were no vitals taken for this visit.  Physical Exam  Results: No results found for this or any previous visit (from the past 24 hour(s)).   Assessment/Plan: No diagnosis found.    No orders of the defined types were placed in this encounter.     No follow-ups on file.  Sharifah Champine B. Philena Obey, PA-C 06/21/2022 8:51 AM

## 2022-06-22 ENCOUNTER — Encounter: Payer: Self-pay | Admitting: Obstetrics and Gynecology

## 2022-06-22 ENCOUNTER — Other Ambulatory Visit: Payer: Self-pay

## 2022-06-22 ENCOUNTER — Other Ambulatory Visit (HOSPITAL_COMMUNITY)
Admission: RE | Admit: 2022-06-22 | Discharge: 2022-06-22 | Disposition: A | Discharge status: Home / Self Care | Payer: BC Managed Care – PPO | Source: Ambulatory Visit | Attending: Obstetrics and Gynecology | Admitting: Obstetrics and Gynecology

## 2022-06-22 ENCOUNTER — Ambulatory Visit (INDEPENDENT_AMBULATORY_CARE_PROVIDER_SITE_OTHER): Payer: BC Managed Care – PPO | Admitting: Obstetrics and Gynecology

## 2022-06-22 VITALS — BP 110/80 | Ht 68.0 in | Wt 177.0 lb

## 2022-06-22 DIAGNOSIS — Z1151 Encounter for screening for human papillomavirus (HPV): Secondary | ICD-10-CM

## 2022-06-22 DIAGNOSIS — Z3202 Encounter for pregnancy test, result negative: Secondary | ICD-10-CM | POA: Diagnosis not present

## 2022-06-22 DIAGNOSIS — Z30014 Encounter for initial prescription of intrauterine contraceptive device: Secondary | ICD-10-CM

## 2022-06-22 DIAGNOSIS — Z124 Encounter for screening for malignant neoplasm of cervix: Secondary | ICD-10-CM | POA: Insufficient documentation

## 2022-06-22 LAB — POCT URINE PREGNANCY: Preg Test, Ur: NEGATIVE

## 2022-06-22 MED ORDER — MISOPROSTOL 100 MCG PO TABS
100.0000 ug | ORAL_TABLET | Freq: Once | ORAL | 0 refills | Status: DC
Start: 2022-06-22 — End: 2022-06-22

## 2022-06-22 MED ORDER — MISOPROSTOL 100 MCG PO TABS
100.0000 ug | ORAL_TABLET | Freq: Once | ORAL | 0 refills | Status: DC
Start: 2022-06-22 — End: 2022-07-23

## 2022-06-22 NOTE — Patient Instructions (Signed)
I value your feedback and you entrusting us with your care. If you get a [facility] patient survey, I would appreciate you taking the time to let us know about your experience today. Thank you!  Instructions after IUD insertion  Most women experience no significant problems after insertion of an IUD, however minor cramping and spotting for a few days is common. Cramps may be treated with ibuprofen 800mg every 8 hours or Tylenol 650 mg every 4 hours. Contact [facility] immediately if you experience any of the following symptoms during the next week: temperature >99.6 degrees, worsening pelvic pain, abdominal pain, fainting, unusually heavy vaginal bleeding, foul vaginal discharge, or if you think you have expelled the IUD. Nothing inserted in the vagina for 48 hours. You will be scheduled for a follow up visit in approximately four weeks.    

## 2022-06-25 LAB — CYTOLOGY - PAP
Comment: NEGATIVE
Diagnosis: NEGATIVE
Diagnosis: REACTIVE
High risk HPV: NEGATIVE

## 2022-07-03 ENCOUNTER — Other Ambulatory Visit: Payer: Self-pay | Admitting: Nurse Practitioner

## 2022-07-03 DIAGNOSIS — F320 Major depressive disorder, single episode, mild: Secondary | ICD-10-CM

## 2022-07-03 NOTE — Telephone Encounter (Signed)
Requested Prescriptions  Pending Prescriptions Disp Refills   buPROPion (WELLBUTRIN) 75 MG tablet [Pharmacy Med Name: BUPROPION 75MG  TABLETS] 90 tablet 1    Sig: TAKE 1 TABLET(75 MG) BY MOUTH DAILY     Psychiatry: Antidepressants - bupropion Passed - 07/03/2022 11:44 AM      Passed - Cr in normal range and within 360 days    Creat  Date Value Ref Range Status  05/22/2022 0.61 0.50 - 0.99 mg/dL Final         Passed - AST in normal range and within 360 days    AST  Date Value Ref Range Status  05/22/2022 19 10 - 30 U/L Final         Passed - ALT in normal range and within 360 days    ALT  Date Value Ref Range Status  05/22/2022 24 6 - 29 U/L Final         Passed - Completed PHQ-2 or PHQ-9 in the last 360 days      Passed - Last BP in normal range    BP Readings from Last 1 Encounters:  06/22/22 110/80         Passed - Valid encounter within last 6 months    Recent Outpatient Visits           1 month ago Mild major depression [facility])    [facility] Melissa Cory, MD   6 months ago Persistent headaches   [facility] Berniece Salines, FNP   7 months ago Generalized anxiety disorder   [facility] Berniece Salines, FNP   1 year ago Generalized anxiety disorder   [facility] Berniece Salines, FNP   1 year ago Lump in neck   [facility] Berniece Salines, FNP       Future Appointments             In 4 months Melissa Harrison, Melissa Sevin, FNP [facility], [facility]

## 2022-07-14 ENCOUNTER — Telehealth: Payer: Self-pay

## 2022-07-14 MED ORDER — MEDROXYPROGESTERONE ACETATE 10 MG PO TABS: 10.0000 mg | ORAL_TABLET | Freq: Every day | ORAL | 0 refills | Status: DC

## 2022-07-14 NOTE — Telephone Encounter (Signed)
Pt aware.

## 2022-07-14 NOTE — Telephone Encounter (Signed)
Pt states she came in for a IUD but she wasn't on her period and she couldn't get the IUD. Pt states she still has not had her period, she's not pregnant, is there a medication she can take to make her period start so she can get the iud?

## 2022-07-14 NOTE — Telephone Encounter (Signed)
Rx provera eRxd. Pt to take first AM UPT. If neg, start provera daily for 7 days. Menses should start 1-10 days after last pill. F/u if no menses bc needs further eval then. If does bleed, then pt to call office to schedule IUD insertion. Thx

## 2022-07-15 DIAGNOSIS — G4733 Obstructive sleep apnea (adult) (pediatric): Secondary | ICD-10-CM | POA: Diagnosis not present

## 2022-07-17 DIAGNOSIS — G4733 Obstructive sleep apnea (adult) (pediatric): Secondary | ICD-10-CM | POA: Diagnosis not present

## 2022-07-21 ENCOUNTER — Encounter: Payer: Self-pay | Admitting: Nurse Practitioner

## 2022-07-21 ENCOUNTER — Other Ambulatory Visit: Payer: Self-pay | Admitting: Nurse Practitioner

## 2022-07-21 DIAGNOSIS — F411 Generalized anxiety disorder: Secondary | ICD-10-CM

## 2022-07-21 MED ORDER — DIAZEPAM 2 MG PO TABS
2.0000 mg | ORAL_TABLET | Freq: Four times a day (QID) | ORAL | 0 refills | Status: DC | PRN
Start: 2022-07-21 — End: 2022-07-28

## 2022-07-23 ENCOUNTER — Ambulatory Visit (INDEPENDENT_AMBULATORY_CARE_PROVIDER_SITE_OTHER): Payer: BC Managed Care – PPO | Admitting: Obstetrics and Gynecology

## 2022-07-23 ENCOUNTER — Encounter: Payer: Self-pay | Admitting: Obstetrics and Gynecology

## 2022-07-23 ENCOUNTER — Other Ambulatory Visit: Payer: Self-pay | Admitting: Nurse Practitioner

## 2022-07-23 VITALS — BP 116/80 | Ht 68.0 in | Wt 180.0 lb

## 2022-07-23 DIAGNOSIS — Z538 Procedure and treatment not carried out for other reasons: Secondary | ICD-10-CM | POA: Diagnosis not present

## 2022-07-23 DIAGNOSIS — Z3009 Encounter for other general counseling and advice on contraception: Secondary | ICD-10-CM | POA: Diagnosis not present

## 2022-07-23 NOTE — Patient Instructions (Signed)
I value your feedback and you entrusting us with your care. If you get a [facility] patient survey, I would appreciate you taking the time to let us know about your experience today. Thank you!  Instructions after IUD insertion  Most women experience no significant problems after insertion of an IUD, however minor cramping and spotting for a few days is common. Cramps may be treated with ibuprofen 800mg every 8 hours or Tylenol 650 mg every 4 hours. Contact [facility] immediately if you experience any of the following symptoms during the next week: temperature >99.6 degrees, worsening pelvic pain, abdominal pain, fainting, unusually heavy vaginal bleeding, foul vaginal discharge, or if you think you have expelled the IUD. Nothing inserted in the vagina for 48 hours. You will be scheduled for a follow up visit in approximately four weeks.    

## 2022-07-23 NOTE — Progress Notes (Addendum)
   Chief Complaint  Patient presents with   Contraception    Mirena insertion     IUD PROCEDURE NOTE:  Melissa Harrison is a 42 y.o. G1P0 here for Mirena  IUD insertion for [facility]. Needs to be off estrogen containing OCPs. Going to start tamoxifen due to increased risk of breast cancer. Tried to insert IUD 06/22/22 but couldn't get through cx os. On period today, took cytotec.    BP 116/80   Ht 5\' 8"  (1.727 m)   Wt 180 lb (81.6 kg)   LMP 07/23/2022 (Exact Date)   BMI 27.37 kg/m   IUD Insertion Procedure Note Patient identified, informed consent performed, consent signed.   Discussed risks of irregular bleeding, cramping, infection, malpositioning or misplacement of the IUD outside the uterus which may require further procedure such as laparoscopy, risk of failure <1%. Time out was performed.    Speculum placed in the vagina.  Cervix visualized.  Cleaned with Betadine x 2.  Grasped anteriorly with a single tooth tenaculum.  Unable to sound uterus since couldn't get through internal cx os. Pt asked to stop procedure and didn't want to try dilators. Pt doesn't want to try IUD again.   ASSESSMENT:  Unsuccessful IUD insertion--pt asked me to stop procedure due to pain. Pt to see if she can do POPs with tamoxifen--she will discuss with oncology and f/u prn.   Encounter for other general counseling or advice on contraception   Plan:  Patient was given post-procedure instructions.  She was advised to have backup contraception for one week.   Call if you are having increasing pain, cramps or bleeding or if you have a fever greater than 100.4 degrees F., shaking chills, nausea or vomiting. Patient was also asked to check IUD strings periodically and follow up in 4 weeks for IUD check.  Return if symptoms worsen or fail to improve.  Dennie Moltz B. Nickol Collister, PA-C 07/23/2022 4:39 PM

## 2022-07-24 ENCOUNTER — Telehealth: Payer: Self-pay

## 2022-07-24 DIAGNOSIS — K5100 Ulcerative (chronic) pancolitis without complications: Secondary | ICD-10-CM | POA: Diagnosis not present

## 2022-07-24 NOTE — Telephone Encounter (Signed)
Pt called triage, oncology said no to POP's. They advised pt to ask Melissa Harrison if she can refer her somewhere to have IUD put in under "conscious sedation".

## 2022-07-24 NOTE — Telephone Encounter (Signed)
Requested Prescriptions  Pending Prescriptions Disp Refills   levothyroxine (SYNTHROID) 112 MCG tablet [Pharmacy Med Name: LEVOTHYROXINE 0.112MG  ( ) TABS] 45 tablet 1    Sig: TAKE 1 TABLET BY MOUTH EVERY OTHER DAY. ALTERNATE WITH 125 MCG DOSE     Endocrinology:  Hypothyroid Agents Passed - 07/23/2022  8:57 PM      Passed - TSH in normal range and within 360 days    TSH  Date Value Ref Range Status  05/22/2022 0.87 mIU/L Final    Comment:              Reference Range .           > or = 20 Years  0.40-4.50 .                Pregnancy Ranges           First trimester    0.26-2.66           Second trimester   0.55-2.73           Third trimester    0.43-2.91          Passed - Valid encounter within last 12 months    Recent Outpatient Visits           2 months ago Mild major depression [facility])   [facility] Alba Cory, MD   7 months ago Persistent headaches   [facility] Berniece Salines, FNP   7 months ago Generalized anxiety disorder   [facility] Berniece Salines, FNP   1 year ago Generalized anxiety disorder   [facility] Berniece Salines, FNP   1 year ago Lump in neck   [facility] Berniece Salines, FNP       Future Appointments             In 4 months Zane Herald, Rudolpho Sevin, FNP [facility], [facility]

## 2022-07-28 ENCOUNTER — Encounter: Payer: Self-pay | Admitting: Obstetrics and Gynecology

## 2022-07-28 DIAGNOSIS — F411 Generalized anxiety disorder: Secondary | ICD-10-CM

## 2022-07-28 MED ORDER — OXYCODONE-ACETAMINOPHEN 5-325 MG PO TABS: ORAL_TABLET | ORAL | 0 refills | Status: DC

## 2022-07-28 MED ORDER — DIAZEPAM 2 MG PO TABS: ORAL_TABLET | ORAL | 0 refills | Status: DC

## 2022-07-28 NOTE — Telephone Encounter (Signed)
Spoke with Dr. Valentino Saxon who recommended valium, percocet and possible cx block/numbing vs doing in OR. Pt would like to try in office. Will have office schedule with pt.

## 2022-07-28 NOTE — Telephone Encounter (Signed)
See duplicate message.  ?

## 2022-08-15 ENCOUNTER — Other Ambulatory Visit: Payer: Self-pay | Admitting: Family Medicine

## 2022-08-15 DIAGNOSIS — G4733 Obstructive sleep apnea (adult) (pediatric): Secondary | ICD-10-CM | POA: Diagnosis not present

## 2022-08-15 DIAGNOSIS — F320 Major depressive disorder, single episode, mild: Secondary | ICD-10-CM

## 2022-08-20 ENCOUNTER — Other Ambulatory Visit: Payer: Self-pay | Admitting: Nurse Practitioner

## 2022-08-20 ENCOUNTER — Encounter: Payer: Self-pay | Admitting: Nurse Practitioner

## 2022-08-20 DIAGNOSIS — E038 Other specified hypothyroidism: Secondary | ICD-10-CM

## 2022-08-20 MED ORDER — LEVOTHYROXINE SODIUM 125 MCG PO TABS
ORAL_TABLET | ORAL | 1 refills | Status: DC
Start: 2022-08-20 — End: 2023-01-27

## 2022-08-20 MED ORDER — LEVOTHYROXINE SODIUM 112 MCG PO TABS: ORAL_TABLET | ORAL | 1 refills | Status: DC

## 2022-08-20 NOTE — Telephone Encounter (Signed)
Requested medication (s) are due for refill today: expired medication  Requested medication (s) are on the active medication list: yes   Last refill:  06/19/21 #135 3 refills   Future visit scheduled: yes in 3 months  Notes to clinic:  expired medication. Do you want to renew Rx?     Requested Prescriptions  Pending Prescriptions Disp Refills   levothyroxine (SYNTHROID) 125 MCG tablet [Pharmacy Med Name: LEVOTHYROXINE 0.125MG  ( ) TAB] 135 tablet 3    Sig: TAKE 1 TABLET BY MOUTH EVERY OTHER DAY, ALTERNATE WITH 112 MCG DOSE     Endocrinology:  Hypothyroid Agents Passed - 08/20/2022  6:21 AM      Passed - TSH in normal range and within 360 days    TSH  Date Value Ref Range Status  05/22/2022 0.87 mIU/L Final    Comment:              Reference Range .           > or = 20 Years  0.40-4.50 .                Pregnancy Ranges           First trimester    0.26-2.66           Second trimester   0.55-2.73           Third trimester    0.43-2.91          Passed - Valid encounter within last 12 months    Recent Outpatient Visits           3 months ago Mild major depression [facility])   [facility] Cory, MD   8 months ago Persistent headaches   [facility] Berniece Salines, FNP   8 months ago Generalized anxiety disorder   [facility] Berniece Salines, FNP   1 year ago Generalized anxiety disorder   [facility] Berniece Salines, FNP   1 year ago Lump in neck   [facility] Berniece Salines, FNP       Future Appointments             In 3 months Zane Herald, Rudolpho Sevin, FNP [facility], [facility]

## 2022-08-20 NOTE — Telephone Encounter (Signed)
Requested by interface surescripts. Duplicate request.  Requested Prescriptions  Refused Prescriptions Disp Refills   levothyroxine (SYNTHROID) 125 MCG tablet [Pharmacy Med Name: LEVOTHYROXINE 0.125MG  ( ) TAB] 135 tablet 3    Sig: TAKE 1 TABLET BY MOUTH EVERY OTHER DAY, ALTERNATE WITH 112 MCG DOSE.     Endocrinology:  Hypothyroid Agents Passed - 08/20/2022  6:21 AM      Passed - TSH in normal range and within 360 days    TSH  Date Value Ref Range Status  05/22/2022 0.87 mIU/L Final    Comment:              Reference Range .           > or = 20 Years  0.40-4.50 .                Pregnancy Ranges           First trimester    0.26-2.66           Second trimester   0.55-2.73           Third trimester    0.43-2.91          Passed - Valid encounter within last 12 months    Recent Outpatient Visits           3 months ago Mild major depression [facility])   [facility] Al[facility] Cory, MD   8 months ago Persistent headaches   [facility] Berniece Salines, FNP   8 months ago Generalized anxiety disorder   [facility] Berniece Salines, FNP   1 year ago Generalized anxiety disorder   [facility] Berniece Salines, FNP   1 year ago Lump in neck   [facility] Berniece Salines, FNP       Future Appointments             In 3 months Zane Herald, Rudolpho Sevin, FNP [facility], [facility]

## 2022-08-25 ENCOUNTER — Telehealth: Payer: Self-pay | Admitting: Obstetrics and Gynecology

## 2022-08-25 NOTE — Telephone Encounter (Signed)
The patient called back and said she is fine with waiting until 7/10 with Dr. Valentino Saxon for IUD placement.

## 2022-08-25 NOTE — Telephone Encounter (Signed)
Ok, thank you

## 2022-08-25 NOTE — Telephone Encounter (Addendum)
I contacted the patient via phone. I left voicemail to offer tomorrow Wednesday, 6/19 at 4:15 pm with Dr. Valentino Saxon. It was ok'd by Dr. Valentino Saxon.

## 2022-08-28 ENCOUNTER — Ambulatory Visit: Payer: BC Managed Care – PPO | Admitting: Obstetrics and Gynecology

## 2022-09-11 ENCOUNTER — Ambulatory Visit: Payer: BC Managed Care – PPO | Admitting: Obstetrics and Gynecology

## 2022-09-12 ENCOUNTER — Encounter: Payer: Self-pay | Admitting: Nurse Practitioner

## 2022-09-14 DIAGNOSIS — G4733 Obstructive sleep apnea (adult) (pediatric): Secondary | ICD-10-CM | POA: Diagnosis not present

## 2022-09-16 ENCOUNTER — Ambulatory Visit: Payer: BC Managed Care – PPO | Admitting: Obstetrics and Gynecology

## 2022-11-14 ENCOUNTER — Other Ambulatory Visit: Payer: Self-pay | Admitting: Family Medicine

## 2022-11-14 DIAGNOSIS — F411 Generalized anxiety disorder: Secondary | ICD-10-CM

## 2022-11-14 DIAGNOSIS — F320 Major depressive disorder, single episode, mild: Secondary | ICD-10-CM

## 2022-11-20 ENCOUNTER — Ambulatory Visit: Payer: Managed Care, Other (non HMO) | Admitting: Nurse Practitioner

## 2022-11-20 ENCOUNTER — Other Ambulatory Visit: Payer: Self-pay

## 2022-11-20 ENCOUNTER — Encounter: Payer: Self-pay | Admitting: Nurse Practitioner

## 2022-11-20 VITALS — BP 122/84 | HR 84 | Temp 97.9°F | Resp 16 | Ht 68.0 in | Wt 183.2 lb

## 2022-11-20 DIAGNOSIS — J45909 Unspecified asthma, uncomplicated: Secondary | ICD-10-CM | POA: Insufficient documentation

## 2022-11-20 DIAGNOSIS — F411 Generalized anxiety disorder: Secondary | ICD-10-CM | POA: Diagnosis not present

## 2022-11-20 DIAGNOSIS — N6092 Unspecified benign mammary dysplasia of left breast: Secondary | ICD-10-CM

## 2022-11-20 DIAGNOSIS — F320 Major depressive disorder, single episode, mild: Secondary | ICD-10-CM | POA: Diagnosis not present

## 2022-11-20 DIAGNOSIS — D509 Iron deficiency anemia, unspecified: Secondary | ICD-10-CM

## 2022-11-20 DIAGNOSIS — J4530 Mild persistent asthma, uncomplicated: Secondary | ICD-10-CM

## 2022-11-20 DIAGNOSIS — K5190 Ulcerative colitis, unspecified, without complications: Secondary | ICD-10-CM

## 2022-11-20 DIAGNOSIS — E038 Other specified hypothyroidism: Secondary | ICD-10-CM

## 2022-11-20 DIAGNOSIS — E663 Overweight: Secondary | ICD-10-CM | POA: Insufficient documentation

## 2022-11-20 DIAGNOSIS — E063 Autoimmune thyroiditis: Secondary | ICD-10-CM

## 2022-11-20 DIAGNOSIS — E538 Deficiency of other specified B group vitamins: Secondary | ICD-10-CM | POA: Insufficient documentation

## 2022-11-20 DIAGNOSIS — M62838 Other muscle spasm: Secondary | ICD-10-CM

## 2022-11-20 MED ORDER — BUPROPION HCL ER (SR) 100 MG PO TB12
100.0000 mg | ORAL_TABLET | Freq: Every day | ORAL | 1 refills | Status: DC
Start: 2022-11-20 — End: 2023-05-17

## 2022-11-20 MED ORDER — ZEPBOUND 2.5 MG/0.5ML ~~LOC~~ SOAJ
2.5000 mg | SUBCUTANEOUS | 0 refills | Status: DC
Start: 2022-11-20 — End: 2022-12-28

## 2022-11-20 MED ORDER — METHOCARBAMOL 500 MG PO TABS
500.0000 mg | ORAL_TABLET | Freq: Two times a day (BID) | ORAL | 3 refills | Status: DC
Start: 2022-11-20 — End: 2023-06-30

## 2022-11-20 NOTE — Assessment & Plan Note (Signed)
Does not require treatment. stable

## 2022-11-20 NOTE — Progress Notes (Signed)
BP 122/84   Pulse 84   Temp 97.9 F (36.6 C) (Oral)   Resp 16   Ht 5\' 8"  (1.727 m)   Wt 183 lb 3.2 oz (83.1 kg)   SpO2 99%   BMI 27.86 kg/m    Subjective:    Patient ID: Melissa Harrison, female    DOB: [birth year], 42 y.o.   MRN: [id number]  HPI: Melissa Harrison is a 42 y.o. female, here alone  Chief Complaint  Patient presents with   Hypothyroidism   Spasms    Refill medication   Depression    Changes in wellbutrin now taking 100mg    Depression/anxiety Medication wellbutrin 100 mg daily, zoloft 25 mg daily Compliant yes Side effects none PHQ9 negative  GAD negative     11/20/2022   12:59 PM 05/22/2022    9:48 AM 12/16/2021    2:47 PM 11/28/2021    9:10 AM  GAD 7 : Generalized Anxiety Score  Nervous, Anxious, on Edge 0 1 0 0  Control/stop worrying 0 1 0 0  Worry too much - different things 0 1 0 0  Trouble relaxing 0 1 0 0  Restless 0 0 0 0  Easily annoyed or irritable 0 1 0 0  Afraid - awful might happen 0 1 0 0  Total GAD 7 Score 0 6 0 0  Anxiety Difficulty Not difficult at all Somewhat difficult Not difficult at all Not difficult at all        11/20/2022   12:59 PM 11/20/2022   12:58 PM 05/22/2022    9:48 AM 12/16/2021    2:45 PM 11/28/2021    9:07 AM  Depression screen PHQ 2/9  Decreased Interest 0 0 0 0 0  Down, Depressed, Hopeless 0 0 1 0 0  PHQ - 2 Score 0 0 1 0 0  Altered sleeping 0  0 0 0  Tired, decreased energy 0  1 0 0  Change in appetite 0  0 0 0  Feeling bad or failure about yourself  0  0 0 0  Trouble concentrating 0  0 0 0  Moving slowly or fidgety/restless 0  0 0 0  Suicidal thoughts 0  0 0 0  PHQ-9 Score 0  2 0 0  Difficult doing work/chores Not difficult at all  Not difficult at all Not difficult at all Not difficult at all     Hypothyroidism:currently taking levothyroxine 112 mcg and 125 mcg alternating days. Last TSH was in normal range.  She denies any constipation, palpitations.  Has noticed some weight gain. Getting  labs today.   Atypical lobular hyperplasia [facility]) of left breast: Patient had a mammogram on 09/30/2021. which showed 1. There is an incidentally sonographically identified 7 mm mass like area at 2:30 5 cm from the nipple which is indeterminate. Recommend ultrasound-guided biopsy for definitive characterization.  She has since had an ultrasound and a biopsy done.  After she spoke to the surgical oncologist they are going to keep an eye on it.  She reported at last visit that she was going to be free evaluated at the beginning of the year. Last seen on 10/26/2022 by surgical oncology. Visit  notes : No concerning findings on exam today. I will call her with results from her bilateral breast screening mammogram when available.  We discussed recent IUD placement and when to initiate Tamoxifen for risk reduction. Given the recent placement of the IUD, balanced discussion was had and she plans  to start Tamoxifen with her next visit. She would like to see how the IUD works for her over several months.  Reiterated Gail Model risk calculation which demonstrated her 5 year risk being 2.8% vs average risk of 0.7%. Her lifetime risk to age 70 is 28.9% vs 12.2%.  We discussed given her elevated risk of >20% lifetime risk in conjunction with ALH diagnosis, we recommend alternating imaging with bilateral breast MRI and mammograms. She had bilateral breast screening mammogram today and will return in 6 months for bilateral breast MRI and office visit for exam.  She did get an IUD put in.  Ulcerative colitis: followed by GI, last seen on 11/13/2022. Treatment currently VZD 300mg  iv q8w. Patient reports no changes and doing well.   Asthma:  patient reports no issues, not currently taking anything.   Iron deficiency/ b12 deficiency: last labs checked back in March. She had low ferritin and low b12.  It was recommend she get b12 injections and take iron supplement. Recent recheck on ferritin had it back in normal range.   Patient reports she has been taking multivitamin with iron and B12.  Iron/TIBC/Ferritin/ %Sat    Component Value Date/Time   IRON 83 05/22/2022 1055   IRON 16 (L) 08/13/2015 1449   TIBC 410 05/22/2022 1055   TIBC 408 08/13/2015 1449   FERRITIN 14 (L) 05/22/2022 1055   FERRITIN 15 10/28/2018 1044   IRONPCTSAT 20 05/22/2022 1055    Muscle spasms: she reports she needs a refill on her muscle relaxer.   Overweight:  Current weight : 183 lbs BMI: 27.86 Highest weight:195 lbs Treatment Tried: lifestyle modification Comorbidities: hypothyroidism, depression,uc  She denies any personal history of pancreatitis,  no family history of thyroid cancer.   Relevant past medical, surgical, family and social history reviewed and updated as indicated. Interim medical history since our last visit reviewed. Allergies and medications reviewed and updated.  Review of Systems  Constitutional: Negative for fever or weight change.  Respiratory: Negative for cough and shortness of breath.   Cardiovascular: Negative for chest pain or palpitations.  Gastrointestinal: Negative for abdominal pain, no bowel changes.  Musculoskeletal: Negative for gait problem or joint swelling.  Skin: Negative for rash.  Neurological: Negative for dizziness or headache.  No other specific complaints in a complete review of systems (except as listed in HPI above).      Objective:    BP 122/84   Pulse 84   Temp 97.9 F (36.6 C) (Oral)   Resp 16   Ht 5\' 8"  (1.727 m)   Wt 183 lb 3.2 oz (83.1 kg)   SpO2 99%   BMI 27.86 kg/m   Wt Readings from Last 3 Encounters:  11/20/22 183 lb 3.2 oz (83.1 kg)  07/23/22 180 lb (81.6 kg)  06/22/22 177 lb (80.3 kg)    Physical Exam  Constitutional: Patient appears well-developed and well-nourished. No distress.  HEENT: head atraumatic, normocephalic, pupils equal and reactive to light, ears TMs clear, neck supple, throat within normal limits Cardiovascular: Normal rate, regular  rhythm and normal heart sounds.  No murmur heard. No BLE edema. Pulmonary/Chest: Effort normal and breath sounds normal. No respiratory distress. Abdominal: Soft.  There is no tenderness. Psychiatric: Patient has a normal mood and affect. behavior is normal. Judgment and thought content normal.   Results for orders placed or performed in visit on 06/22/22  POCT urine pregnancy  Result Value Ref Range   Preg Test, Ur Negative Negative  Cytology -  PAP  Result Value Ref Range   High risk HPV Negative    Adequacy      Satisfactory for evaluation; transformation zone component PRESENT.   Diagnosis      - Negative for Intraepithelial Lesions or Malignancy (NILM)   Diagnosis - Benign reactive/reparative changes    Comment Normal Reference Range HPV - Negative       Assessment & Plan:   Problem List Items Addressed This Visit       Respiratory   Asthma, well controlled    Does not require treatment. stable        Digestive   Ulcerative colitis (HCC)    Doing well, had infusion today. Treatment currently VZD 300mg  iv q8w.        Endocrine   Hypothyroidism due to Hashimoto's thyroiditis - Primary    Patient reports weight gain,  will check thyroid level .  Currently taking  levothyroxine 112 mcg and 125 mcg alternating days.       Relevant Orders   TSH     Other   Anemia, iron deficiency    Taking multivitamin      Generalized anxiety disorder    Currently taking zoloft 25 mg daily and wellbutrin 100 mg daily      Relevant Medications   buPROPion ER (WELLBUTRIN SR) 100 MG 12 hr tablet   Atypical lobular hyperplasia (ALH) of left breast    Got IUD in. will return in 6 months for bilateral breast MRI and  exam at surgical oncology office      Mild major depression (HCC)    Currently taking zoloft 25 mg daily and wellbutrin 100 mg daily      Relevant Medications   buPROPion ER (WELLBUTRIN SR) 100 MG 12 hr tablet   B12 deficiency   Relevant Orders   B12 and  Folate Panel   Overweight    Continue lifestyle modification, working on getting approved for zepbound.       Relevant Medications   tirzepatide (ZEPBOUND) 2.5 MG/0.5ML Pen   Other Visit Diagnoses     Night muscle spasms       muscle relaxer refilled   Relevant Medications   methocarbamol (ROBAXIN) 500 MG tablet         Follow up plan: Return in about 3 months (around 02/19/2023) for follow up for weight check after starting zepbound.

## 2022-11-20 NOTE — Assessment & Plan Note (Signed)
Taking multivitamin

## 2022-11-20 NOTE — Assessment & Plan Note (Signed)
Continue lifestyle modification, working on getting approved for zepbound.

## 2022-11-20 NOTE — Assessment & Plan Note (Signed)
Currently taking zoloft 25 mg daily and wellbutrin 100 mg daily

## 2022-11-20 NOTE — Assessment & Plan Note (Signed)
Doing well, had infusion today. Treatment currently VZD 300mg  iv q8w.

## 2022-11-20 NOTE — Assessment & Plan Note (Signed)
Patient reports weight gain,  will check thyroid level .  Currently taking  levothyroxine 112 mcg and 125 mcg alternating days.

## 2022-11-20 NOTE — Assessment & Plan Note (Signed)
Got IUD in. will return in 6 months for bilateral breast MRI and  exam at surgical oncology office

## 2022-11-21 LAB — B12 AND FOLATE PANEL
Folate: 20.3 ng/mL
Vitamin B-12: 231 pg/mL (ref 200–1100)

## 2022-11-21 LAB — TSH: TSH: 0.34 m[IU]/L — ABNORMAL LOW

## 2022-11-23 ENCOUNTER — Ambulatory Visit: Payer: BC Managed Care – PPO | Admitting: Nurse Practitioner

## 2022-12-14 ENCOUNTER — Encounter: Payer: Self-pay | Admitting: Nurse Practitioner

## 2022-12-23 ENCOUNTER — Other Ambulatory Visit: Payer: Self-pay

## 2022-12-28 ENCOUNTER — Other Ambulatory Visit: Payer: Self-pay | Admitting: Nurse Practitioner

## 2022-12-28 ENCOUNTER — Telehealth: Payer: Self-pay

## 2022-12-28 DIAGNOSIS — E663 Overweight: Secondary | ICD-10-CM

## 2022-12-28 MED ORDER — SAXENDA 18 MG/3ML ~~LOC~~ SOPN: 0.6000 mg | PEN_INJECTOR | Freq: Every day | SUBCUTANEOUS | 0 refills | Status: DC

## 2022-12-28 NOTE — Telephone Encounter (Signed)
(  Total call time of: 23 minutes and 32 seconds), After attempting prior authorization for Zepbound through Cover My Meds, it referred me to contact [employer] at [phone] for further assistance, after holding and speaking to the representative, they stated anti-obesity medications are not covered through patient's current benefit plan.   Patient notified.

## 2023-01-24 ENCOUNTER — Other Ambulatory Visit: Payer: Self-pay | Admitting: Nurse Practitioner

## 2023-01-24 DIAGNOSIS — E063 Autoimmune thyroiditis: Secondary | ICD-10-CM

## 2023-01-25 NOTE — Telephone Encounter (Signed)
Requested medications are due for refill today.  yes  Requested medications are on the active medications list.  yes  Last refill. 08/20/2022 #45 1 rf  Future visit scheduled.   no  Notes to clinic.  Abnormal labs.    Requested Prescriptions  Pending Prescriptions Disp Refills   levothyroxine (SYNTHROID) 125 MCG tablet [Pharmacy Med Name: LEVOTHYROXINE 0.125MG  ( ) TAB] 45 tablet 1    Sig: TAKE 1 TABLET BY MOUTH EVERY OTHER DAY, ALTERNATE WITH 112 MCG DOSE     Endocrinology:  Hypothyroid Agents Failed - 01/24/2023 10:13 AM      Failed - TSH in normal range and within 360 days    TSH  Date Value Ref Range Status  11/20/2022 0.34 (L) mIU/L Final    Comment:              Reference Range .           > or = 20 Years  0.40-4.50 .                Pregnancy Ranges           First trimester    0.26-2.66           Second trimester   0.55-2.73           Third trimester    0.43-2.91          Passed - Valid encounter within last 12 months    Recent Outpatient Visits           2 months ago Hypothyroidism due to Hashimoto's thyroiditis   [facility] Berniece Salines, FNP   8 months ago Mild major depression [facility])   [facility] Cory, MD   1 year ago Persistent headaches   [facility] Berniece Salines, FNP   1 year ago Generalized anxiety disorder   [facility] Berniece Salines, FNP   1 year ago Generalized anxiety disorder   [facility] Berniece Salines, FNP

## 2023-01-26 ENCOUNTER — Other Ambulatory Visit: Payer: Self-pay | Admitting: Nurse Practitioner

## 2023-01-26 DIAGNOSIS — E663 Overweight: Secondary | ICD-10-CM

## 2023-01-26 MED ORDER — SAXENDA 18 MG/3ML ~~LOC~~ SOPN: 1.2000 mg | PEN_INJECTOR | Freq: Every day | SUBCUTANEOUS | 0 refills | Status: DC

## 2023-04-15 ENCOUNTER — Encounter: Payer: Self-pay | Admitting: Nurse Practitioner

## 2023-04-15 ENCOUNTER — Other Ambulatory Visit: Payer: Self-pay | Admitting: Nurse Practitioner

## 2023-04-15 DIAGNOSIS — F419 Anxiety disorder, unspecified: Secondary | ICD-10-CM

## 2023-04-15 MED ORDER — DIAZEPAM 2 MG PO TABS: ORAL_TABLET | ORAL | 0 refills | Status: DC

## 2023-05-01 ENCOUNTER — Ambulatory Visit: Payer: BC Managed Care – PPO

## 2023-05-15 ENCOUNTER — Other Ambulatory Visit: Payer: Self-pay | Admitting: Nurse Practitioner

## 2023-05-15 DIAGNOSIS — F320 Major depressive disorder, single episode, mild: Secondary | ICD-10-CM

## 2023-05-17 ENCOUNTER — Other Ambulatory Visit: Payer: Self-pay | Admitting: Nurse Practitioner

## 2023-05-17 DIAGNOSIS — F411 Generalized anxiety disorder: Secondary | ICD-10-CM

## 2023-05-17 DIAGNOSIS — F320 Major depressive disorder, single episode, mild: Secondary | ICD-10-CM

## 2023-05-17 NOTE — Telephone Encounter (Signed)
 Patient will need an office visit for additional refills. Requested Prescriptions  Pending Prescriptions Disp Refills   buPROPion ER (WELLBUTRIN SR) 100 MG 12 hr tablet [Pharmacy Med Name: BUPROPION SR 100MG  TABLETS (12 H)] 90 tablet 0    Sig: TAKE 1 TABLET(100 MG) BY MOUTH DAILY     Psychiatry: Antidepressants - bupropion Passed - 05/17/2023  2:37 PM      Passed - Cr in normal range and within 360 days    Creat  Date Value Ref Range Status  05/22/2022 0.61 0.50 - 0.99 mg/dL Final         Passed - AST in normal range and within 360 days    AST  Date Value Ref Range Status  05/22/2022 19 10 - 30 U/L Final         Passed - ALT in normal range and within 360 days    ALT  Date Value Ref Range Status  05/22/2022 24 6 - 29 U/L Final         Passed - Completed PHQ-2 or PHQ-9 in the last 360 days      Passed - Last BP in normal range    BP Readings from Last 1 Encounters:  11/20/22 122/84         Passed - Valid encounter within last 6 months    Recent Outpatient Visits           5 months ago Hypothyroidism due to Hashimoto's thyroiditis   [facility]ce Salines, FNP   12 months ago Mild major depression [facility])   [facility] Cory, MD   1 year ago Persistent headaches   [facility] Berniece Salines, FNP   1 year ago Generalized anxiety disorder   [facility] Berniece Salines, FNP   1 year ago Generalized anxiety disorder   [facility] Berniece Salines, FNP

## 2023-05-18 NOTE — Telephone Encounter (Signed)
 Patient will need an office visit for additional refills. Requested Prescriptions  Pending Prescriptions Disp Refills   sertraline (ZOLOFT) 50 MG tablet [Pharmacy Med Name: SERTRALINE 50MG  TABLETS] 90 tablet 1    Sig: TAKE 1 TABLET(50 MG) BY MOUTH DAILY     Psychiatry:  Antidepressants - SSRI - sertraline Failed - 05/18/2023 11:14 AM      Failed - AST in normal range and within 360 days    AST  Date Value Ref Range Status  05/22/2022 19 10 - 30 U/L Final         Failed - ALT in normal range and within 360 days    ALT  Date Value Ref Range Status  05/22/2022 24 6 - 29 U/L Final         Passed - Completed PHQ-2 or PHQ-9 in the last 360 days      Passed - Valid encounter within last 6 months    Recent Outpatient Visits           5 months ago Hypothyroidism due to Hashimoto's thyroiditis   [facility] Berniece Salines, FNP   12 months ago Mild major depression [facility])   [facility] Cory, MD   1 year ago Persistent headaches   [facility] Berniece Salines, FNP   1 year ago Generalized anxiety disorder   [facility] Berniece Salines, FNP   1 year ago Generalized anxiety disorder   [facility] Berniece Salines, FNP

## 2023-06-29 ENCOUNTER — Encounter: Payer: Self-pay | Admitting: Nurse Practitioner

## 2023-06-29 ENCOUNTER — Ambulatory Visit: Payer: Self-pay

## 2023-06-30 ENCOUNTER — Other Ambulatory Visit: Payer: Self-pay | Admitting: Nurse Practitioner

## 2023-06-30 DIAGNOSIS — M62838 Other muscle spasm: Secondary | ICD-10-CM

## 2023-06-30 MED ORDER — METHOCARBAMOL 500 MG PO TABS: 500.0000 mg | ORAL_TABLET | Freq: Two times a day (BID) | ORAL | 3 refills | Status: AC

## 2023-07-01 ENCOUNTER — Ambulatory Visit: Payer: Self-pay

## 2023-07-28 ENCOUNTER — Other Ambulatory Visit: Payer: Self-pay | Admitting: Nurse Practitioner

## 2023-07-28 DIAGNOSIS — E063 Autoimmune thyroiditis: Secondary | ICD-10-CM

## 2023-07-29 NOTE — Telephone Encounter (Signed)
 Requested medications are due for refill today.  yes  Requested medications are on the active medications list.  yes  Last refill. 08/20/2022 #45 1 rf  Future visit scheduled.   no  Notes to clinic.  Abnormal labs.    Requested Prescriptions  Pending Prescriptions Disp Refills   levothyroxine  (SYNTHROID ) 112 MCG tablet [Pharmacy Med Name: LEVOTHYROXINE  0.112MG  ( ) TABS] 45 tablet 1    Sig: TAKE 1 TABLET BY MOUTH EVERY OTHER DAY. ALTERNATE WITH 125 MCG DOSE     Endocrinology:  Hypothyroid Agents Failed - 07/29/2023 11:48 AM      Failed - TSH in normal range and within 360 days    TSH  Date Value Ref Range Status  11/20/2022 0.34 (L) mIU/L Final    Comment:              Reference Range .           > or = 20 Years  0.40-4.50 .                Pregnancy Ranges           First trimester    0.26-2.66           Second trimester   0.55-2.73           Third trimester    0.43-2.91          Failed - Valid encounter within last 12 months    Recent Outpatient Visits   None

## 2023-07-29 NOTE — Progress Notes (Signed)
 BP 120/74 (Patient Position: Sitting, Cuff Size: Normal)   Pulse 96   Ht 5\' 8"  (1.727 m)   Wt 175 lb 6.4 oz (79.6 kg)   SpO2 98%   BMI 26.67 kg/m    Subjective:    Patient ID: Melissa Harrison, female    DOB: 01/25/1981, 43 y.o.   MRN: [id number]  HPI: Melissa Harrison is a 43 y.o. female  Chief Complaint  Patient presents with   Medical Management of Chronic Issues   Medication Refill    Discussed the use of AI scribe software for clinical note transcription with the patient, who gave verbal consent to proceed.  History of Present Illness Melissa Harrison is a 43 year old female who presents for a follow-up visit.  She has a history of hypothyroidism, with her last TSH checked on November 20, 2022, showing an abnormal level of 0.34. She has been taking more of the 112 microgram dose of levothyroxine  and feels her thyroid  is functioning well. She has one or two pills left of her current prescription.  She has a history of asthma but currently has no breathing problems, describing her condition as 'easy breezy'.  For her mental health, she is taking 50 mg of Wellbutrin  and 50 mg of sertraline . She experiences difficulty sleeping, which is managed with her current medication regimen. Her mental health is stable.  She also has a B12 deficiency, with her last B12 level being 231. There are no current symptoms related to this deficiency.  In terms of social history, she recently went on a cruise to the [place] and participated in various activities, indicating she is active and engaged in leisure activities.         07/30/2023   10:04 AM 11/20/2022   12:59 PM 11/20/2022   12:58 PM  Depression screen PHQ 2/9  Decreased Interest 0 0 0  Down, Depressed, Hopeless 0 0 0  PHQ - 2 Score 0 0 0  Altered sleeping 0 0   Tired, decreased energy 0 0   Change in appetite 0 0   Feeling bad or failure about yourself  0 0   Trouble concentrating 0 0   Moving slowly or  fidgety/restless 0 0   Suicidal thoughts 0 0   PHQ-9 Score 0 0   Difficult doing work/chores Not difficult at all Not difficult at all        07/30/2023   10:05 AM 11/20/2022   12:59 PM 05/22/2022    9:48 AM 12/16/2021    2:47 PM  GAD 7 : Generalized Anxiety Score  Nervous, Anxious, on Edge 0 0 1 0  Control/stop worrying 0 0 1 0  Worry too much - different things 0 0 1 0  Trouble relaxing 0 0 1 0  Restless 0 0 0 0  Easily annoyed or irritable 0 0 1 0  Afraid - awful might happen 0 0 1 0  Total GAD 7 Score 0 0 6 0  Anxiety Difficulty Not difficult at all Not difficult at all Somewhat difficult Not difficult at all     Relevant past medical, surgical, family and social history reviewed and updated as indicated. Interim medical history since our last visit reviewed. Allergies and medications reviewed and updated.  Review of Systems  Constitutional: Negative for fever or weight change.  Respiratory: Negative for cough and shortness of breath.   Cardiovascular: Negative for chest pain or palpitations.  Gastrointestinal: Negative for abdominal pain, no bowel changes.  Musculoskeletal: Negative for gait problem or joint swelling.  Skin: Negative for rash.  Neurological: Negative for dizziness or headache.  No other specific complaints in a complete review of systems (except as listed in HPI above).      Objective:      BP 120/74 (Patient Position: Sitting, Cuff Size: Normal)   Pulse 96   Ht 5\' 8"  (1.727 m)   Wt 175 lb 6.4 oz (79.6 kg)   SpO2 98%   BMI 26.67 kg/m    Wt Readings from Last 3 Encounters:  07/30/23 175 lb 6.4 oz (79.6 kg)  11/20/22 183 lb 3.2 oz (83.1 kg)  07/23/22 180 lb (81.6 kg)    Physical Exam Vitals reviewed.  Constitutional:      Appearance: Normal appearance.  HENT:     Head: Normocephalic.  Cardiovascular:     Rate and Rhythm: Normal rate and regular rhythm.  Pulmonary:     Effort: Pulmonary effort is normal.     Breath sounds: Normal breath  sounds.  Musculoskeletal:        General: Normal range of motion.  Skin:    General: Skin is warm and dry.  Neurological:     General: No focal deficit present.     Mental Status: She is alert and oriented to person, place, and time. Mental status is at baseline.  Psychiatric:        Mood and Affect: Mood normal.        Behavior: Behavior normal.        Thought Content: Thought content normal.        Judgment: Judgment normal.        Results for orders placed or performed in visit on 11/20/22  B12 and Folate Panel   Collection Time: 11/20/22  1:33 PM  Result Value Ref Range   Vitamin B-12 231 200 - 1,100 pg/mL   Folate 20.3 ng/mL  TSH   Collection Time: 11/20/22  1:33 PM  Result Value Ref Range   TSH 0.34 (L) mIU/L          Assessment & Plan:   Problem List Items Addressed This Visit       Respiratory   Asthma, well controlled - Primary     Endocrine   Hypothyroidism due to Hashimoto's thyroiditis   Relevant Orders   TSH     Other   Generalized anxiety disorder   Relevant Medications   buPROPion  ER (WELLBUTRIN  SR) 100 MG 12 hr tablet   sertraline  (ZOLOFT ) 50 MG tablet   Mild major depression (HCC)   Relevant Medications   buPROPion  ER (WELLBUTRIN  SR) 100 MG 12 hr tablet   sertraline  (ZOLOFT ) 50 MG tablet   B12 deficiency   Relevant Orders   Vitamin B12   Other Visit Diagnoses       Encounter for hepatitis C screening test for low risk patient       Relevant Orders   Hepatitis C antibody     Screening for diabetes mellitus       Relevant Orders   Comprehensive metabolic panel with GFR   Hemoglobin A1c     Screening for cholesterol level       Relevant Orders   Lipid panel     Screening for deficiency anemia       Relevant Orders   CBC with Differential/Platelet        Assessment and Plan Assessment & Plan Hypothyroidism Hypothyroidism with previous abnormal TSH level of 0.34. Current medication  regimen involves alternating doses of  levothyroxine . She reports feeling well with current thyroid  function. - Check TSH levels to assess current thyroid  function. - Adjust levothyroxine  dosage based on TSH results. - Refill levothyroxine  prescription as needed.  Depression Depression managed with sertraline  and Wellbutrin . She reports mental health is well-managed and is currently taking 50 mg of sertraline  and Wellbutrin . - Continue sertraline  50 mg daily. - Refill Wellbutrin  prescription.  Anxiety Anxiety managed with current medication regimen. She reports mental health is well-managed.  B12 deficiency B12 deficiency with last recorded level at 231. No current symptoms reported.        Follow up plan: Return in about 6 months (around 01/30/2024) for follow up.

## 2023-07-30 ENCOUNTER — Encounter: Payer: Self-pay | Admitting: Nurse Practitioner

## 2023-07-30 ENCOUNTER — Ambulatory Visit (INDEPENDENT_AMBULATORY_CARE_PROVIDER_SITE_OTHER): Admitting: Nurse Practitioner

## 2023-07-30 VITALS — BP 120/74 | HR 96 | Ht 68.0 in | Wt 175.4 lb

## 2023-07-30 DIAGNOSIS — E063 Autoimmune thyroiditis: Secondary | ICD-10-CM

## 2023-07-30 DIAGNOSIS — Z13220 Encounter for screening for lipoid disorders: Secondary | ICD-10-CM

## 2023-07-30 DIAGNOSIS — J4520 Mild intermittent asthma, uncomplicated: Secondary | ICD-10-CM | POA: Diagnosis not present

## 2023-07-30 DIAGNOSIS — E538 Deficiency of other specified B group vitamins: Secondary | ICD-10-CM

## 2023-07-30 DIAGNOSIS — F320 Major depressive disorder, single episode, mild: Secondary | ICD-10-CM | POA: Diagnosis not present

## 2023-07-30 DIAGNOSIS — Z130 Encounter for screening for diseases of the blood and blood-forming organs and certain disorders involving the immune mechanism: Secondary | ICD-10-CM

## 2023-07-30 DIAGNOSIS — F411 Generalized anxiety disorder: Secondary | ICD-10-CM | POA: Diagnosis not present

## 2023-07-30 DIAGNOSIS — Z1159 Encounter for screening for other viral diseases: Secondary | ICD-10-CM

## 2023-07-30 DIAGNOSIS — Z131 Encounter for screening for diabetes mellitus: Secondary | ICD-10-CM

## 2023-07-30 MED ORDER — BUPROPION HCL ER (SR) 100 MG PO TB12
100.0000 mg | ORAL_TABLET | Freq: Every day | ORAL | 1 refills | Status: AC
Start: 2023-07-30 — End: ?

## 2023-07-30 MED ORDER — SERTRALINE HCL 50 MG PO TABS: 50.0000 mg | ORAL_TABLET | Freq: Every day | ORAL | 1 refills | Status: DC

## 2023-07-31 ENCOUNTER — Other Ambulatory Visit: Payer: Self-pay | Admitting: Nurse Practitioner

## 2023-07-31 ENCOUNTER — Ambulatory Visit: Payer: Self-pay | Admitting: Nurse Practitioner

## 2023-07-31 DIAGNOSIS — E063 Autoimmune thyroiditis: Secondary | ICD-10-CM

## 2023-07-31 LAB — CBC WITH DIFFERENTIAL/PLATELET
Absolute Lymphocytes: 1037 {cells}/uL (ref 850–3900)
Absolute Monocytes: 599 {cells}/uL (ref 200–950)
Basophils Absolute: 65 {cells}/uL (ref 0–200)
Basophils Relative: 0.4 %
Eosinophils Absolute: 275 {cells}/uL (ref 15–500)
Eosinophils Relative: 1.7 %
HCT: 41.8 % (ref 35.0–45.0)
Hemoglobin: 13.8 g/dL (ref 11.7–15.5)
MCH: 31 pg (ref 27.0–33.0)
MCHC: 33 g/dL (ref 32.0–36.0)
MCV: 93.9 fL (ref 80.0–100.0)
MPV: 9.6 fL (ref 7.5–12.5)
Monocytes Relative: 3.7 %
Neutro Abs: 14224 {cells}/uL — ABNORMAL HIGH (ref 1500–7800)
Neutrophils Relative %: 87.8 %
Platelets: 316 10*3/uL (ref 140–400)
RBC: 4.45 10*6/uL (ref 3.80–5.10)
RDW: 12.1 % (ref 11.0–15.0)
Total Lymphocyte: 6.4 %
WBC: 16.2 10*3/uL — ABNORMAL HIGH (ref 3.8–10.8)

## 2023-07-31 LAB — COMPREHENSIVE METABOLIC PANEL WITH GFR
AG Ratio: 1.5 (calc) (ref 1.0–2.5)
ALT: 16 U/L (ref 6–29)
AST: 14 U/L (ref 10–30)
Albumin: 4.1 g/dL (ref 3.6–5.1)
Alkaline phosphatase (APISO): 65 U/L (ref 31–125)
BUN: 15 mg/dL (ref 7–25)
CO2: 29 mmol/L (ref 20–32)
Calcium: 9.4 mg/dL (ref 8.6–10.2)
Chloride: 99 mmol/L (ref 98–110)
Creat: 0.54 mg/dL (ref 0.50–0.99)
Globulin: 2.7 g/dL (ref 1.9–3.7)
Glucose, Bld: 96 mg/dL (ref 65–99)
Potassium: 3.9 mmol/L (ref 3.5–5.3)
Sodium: 137 mmol/L (ref 135–146)
Total Bilirubin: 0.3 mg/dL (ref 0.2–1.2)
Total Protein: 6.8 g/dL (ref 6.1–8.1)
eGFR: 117 mL/min/{1.73_m2} (ref >=60)

## 2023-07-31 LAB — HEMOGLOBIN A1C
Hgb A1c MFr Bld: 5.5 % (ref <5.7)
Mean Plasma Glucose: 111 mg/dL
eAG (mmol/L): 6.2 mmol/L

## 2023-07-31 LAB — LIPID PANEL
Cholesterol: 195 mg/dL (ref <200)
HDL: 64 mg/dL (ref >=50)
LDL Cholesterol (Calc): 111 mg/dL — ABNORMAL HIGH
Non-HDL Cholesterol (Calc): 131 mg/dL — ABNORMAL HIGH (ref <130)
Total CHOL/HDL Ratio: 3 (calc) (ref <5.0)
Triglycerides: 94 mg/dL (ref <150)

## 2023-07-31 LAB — HEPATITIS C ANTIBODY: Hepatitis C Ab: NONREACTIVE

## 2023-07-31 LAB — VITAMIN B12: Vitamin B-12: 227 pg/mL (ref 200–1100)

## 2023-07-31 LAB — TSH: TSH: 0.36 m[IU]/L — ABNORMAL LOW

## 2023-07-31 MED ORDER — LEVOTHYROXINE SODIUM 112 MCG PO TABS: 112.0000 ug | ORAL_TABLET | Freq: Every day | ORAL | 0 refills | Status: DC

## 2023-09-06 ENCOUNTER — Other Ambulatory Visit: Payer: Self-pay | Admitting: Nurse Practitioner

## 2023-09-06 DIAGNOSIS — E063 Autoimmune thyroiditis: Secondary | ICD-10-CM

## 2023-09-07 NOTE — Telephone Encounter (Signed)
 Requested Prescriptions  Refused Prescriptions Disp Refills   levothyroxine  (SYNTHROID ) 125 MCG tablet [Pharmacy Med Name: LEVOTHYROXINE  0.125MG  ( ) TAB] 45 tablet 1    Sig: TAKE 1 TABLET BY MOUTH EVERY OTHER DAY, ALTERNATE WITH 112 MCG DOSE     Endocrinology:  Hypothyroid Agents Failed - 09/07/2023  3:35 PM      Failed - TSH in normal range and within 360 days    TSH  Date Value Ref Range Status  07/30/2023 0.36 (L) mIU/L Final    Comment:              Reference Range .           > or = 20 Years  0.40-4.50 .                Pregnancy Ranges           First trimester    0.26-2.66           Second trimester   0.55-2.73           Third trimester    0.43-2.91          Failed - Valid encounter within last 12 months    Recent Outpatient Visits           1 month ago Asthma, mild intermittent, well-controlled   [facility] [place], [place], [place]

## 2023-10-15 ENCOUNTER — Ambulatory Visit: Payer: Self-pay

## 2023-10-15 DIAGNOSIS — K5190 Ulcerative colitis, unspecified, without complications: Secondary | ICD-10-CM | POA: Diagnosis present

## 2023-10-25 ENCOUNTER — Other Ambulatory Visit: Payer: Self-pay | Admitting: Nurse Practitioner

## 2023-10-25 DIAGNOSIS — E063 Autoimmune thyroiditis: Secondary | ICD-10-CM

## 2023-10-26 ENCOUNTER — Encounter: Payer: Self-pay | Admitting: Nurse Practitioner

## 2023-10-27 ENCOUNTER — Other Ambulatory Visit: Payer: Self-pay

## 2023-10-27 DIAGNOSIS — E063 Autoimmune thyroiditis: Secondary | ICD-10-CM

## 2023-10-27 LAB — TSH: TSH: 2.17 m[IU]/L

## 2023-10-27 NOTE — Telephone Encounter (Signed)
 Requested Prescriptions  Pending Prescriptions Disp Refills   levothyroxine  (SYNTHROID ) 112 MCG tablet [Pharmacy Med Name: LEVOTHYROXINE  0.112MG  ( ) TABS] 90 tablet 0    Sig: TAKE 1 TABLET(112 MCG) BY MOUTH DAILY BEFORE BREAKFAST     Endocrinology:  Hypothyroid Agents Failed - 10/27/2023 12:34 PM      Failed - TSH in normal range and within 360 days    TSH  Date Value Ref Range Status  07/30/2023 0.36 (L) mIU/L Final    Comment:              Reference Range .           > or = 20 Years  0.40-4.50 .                Pregnancy Ranges           First trimester    0.26-2.66           Second trimester   0.55-2.73           Third trimester    0.43-2.91          Passed - Valid encounter within last 12 months    Recent Outpatient Visits           2 months ago Asthma, mild intermittent, well-controlled   [facility] [place], [place], [place]

## 2023-10-28 ENCOUNTER — Ambulatory Visit: Payer: Self-pay | Admitting: Nurse Practitioner

## 2023-10-30 ENCOUNTER — Other Ambulatory Visit: Payer: Self-pay | Admitting: Nurse Practitioner

## 2023-10-30 DIAGNOSIS — E063 Autoimmune thyroiditis: Secondary | ICD-10-CM

## 2023-11-01 NOTE — Telephone Encounter (Signed)
 Requested Prescriptions  Pending Prescriptions Disp Refills   levothyroxine  (SYNTHROID ) 112 MCG tablet [Pharmacy Med Name: LEVOTHYROXINE  0.112MG  ( ) TABS] 90 tablet 0    Sig: TAKE 1 TABLET(112 MCG) BY MOUTH DAILY BEFORE BREAKFAST     Endocrinology:  Hypothyroid Agents Passed - 11/01/2023  2:14 PM      Passed - TSH in normal range and within 360 days    TSH  Date Value Ref Range Status  10/27/2023 2.17 mIU/L Final    Comment:              Reference Range .           > or = 20 Years  0.40-4.50 .                Pregnancy Ranges           First trimester    0.26-2.66           Second trimester   0.55-2.73           Third trimester    0.43-2.91          Passed - Valid encounter within last 12 months    Recent Outpatient Visits           3 months ago Asthma, mild intermittent, well-controlled   [facility] [place], [place], [place]

## 2024-01-12 ENCOUNTER — Encounter: Payer: Self-pay | Admitting: Nurse Practitioner

## 2024-01-13 ENCOUNTER — Other Ambulatory Visit: Payer: Self-pay

## 2024-01-13 DIAGNOSIS — F419 Anxiety disorder, unspecified: Secondary | ICD-10-CM

## 2024-01-13 MED ORDER — DIAZEPAM 2 MG PO TABS: ORAL_TABLET | ORAL | 0 refills | Status: AC

## 2024-01-25 ENCOUNTER — Other Ambulatory Visit: Payer: Self-pay | Admitting: Nurse Practitioner

## 2024-01-25 DIAGNOSIS — E063 Autoimmune thyroiditis: Secondary | ICD-10-CM

## 2024-01-26 ENCOUNTER — Encounter: Payer: Self-pay | Admitting: Nurse Practitioner

## 2024-01-26 DIAGNOSIS — E063 Autoimmune thyroiditis: Secondary | ICD-10-CM

## 2024-01-26 MED ORDER — LEVOTHYROXINE SODIUM 112 MCG PO TABS: 112.0000 ug | ORAL_TABLET | Freq: Every day | ORAL | 0 refills | Status: DC

## 2024-01-27 NOTE — Telephone Encounter (Signed)
 Requested Prescriptions  Refused Prescriptions Disp Refills   levothyroxine  (SYNTHROID ) 112 MCG tablet [Pharmacy Med Name: LEVOTHYROXINE  0.112MG  ( ) TABS] 45 tablet     Sig: TAKE 1 TABLET BY MOUTH EVERY OTHER DAILY. ALTERNATE WITH 125 MCG DOSE     Endocrinology:  Hypothyroid Agents Passed - 01/27/2024  2:15 PM      Passed - TSH in normal range and within 360 days    TSH  Date Value Ref Range Status  10/27/2023 2.17 mIU/L Final    Comment:              Reference Range .           > or = 20 Years  0.40-4.50 .                Pregnancy Ranges           First trimester    0.26-2.66           Second trimester   0.55-2.73           Third trimester    0.43-2.91          Passed - Valid encounter within last 12 months    Recent Outpatient Visits           6 months ago Asthma, mild intermittent, well-controlled   [facility] [place], [place], [place]

## 2024-01-29 ENCOUNTER — Other Ambulatory Visit: Payer: Self-pay | Admitting: Nurse Practitioner

## 2024-01-29 DIAGNOSIS — F411 Generalized anxiety disorder: Secondary | ICD-10-CM

## 2024-01-29 DIAGNOSIS — F320 Major depressive disorder, single episode, mild: Secondary | ICD-10-CM

## 2024-01-31 NOTE — Telephone Encounter (Signed)
 Requested Prescriptions  Pending Prescriptions Disp Refills   sertraline  (ZOLOFT ) 50 MG tablet [Pharmacy Med Name: SERTRALINE  50MG  TABLETS] 90 tablet 0    Sig: TAKE 1 TABLET(50 MG) BY MOUTH DAILY     Psychiatry:  Antidepressants - SSRI - sertraline  Failed - 01/31/2024  1:51 PM      Failed - Valid encounter within last 6 months    Recent Outpatient Visits           6 months ago Asthma, mild intermittent, well-controlled   [facility], [place], FNP              Passed - AST in normal range and within 360 days    AST  Date Value Ref Range Status  07/30/2023 14 10 - 30 U/L Final         Passed - ALT in normal range and within 360 days    ALT  Date Value Ref Range Status  07/30/2023 16 6 - 29 U/L Final         Passed - Completed PHQ-2 or PHQ-9 in the last 360 days

## 2024-02-14 ENCOUNTER — Encounter: Payer: Self-pay | Admitting: Nurse Practitioner

## 2024-02-14 ENCOUNTER — Ambulatory Visit (INDEPENDENT_AMBULATORY_CARE_PROVIDER_SITE_OTHER): Admitting: Nurse Practitioner

## 2024-02-14 VITALS — BP 132/88 | HR 102 | Temp 98.0°F | Ht 68.0 in | Wt 177.0 lb

## 2024-02-14 DIAGNOSIS — F411 Generalized anxiety disorder: Secondary | ICD-10-CM

## 2024-02-14 DIAGNOSIS — E063 Autoimmune thyroiditis: Secondary | ICD-10-CM

## 2024-02-14 DIAGNOSIS — J4520 Mild intermittent asthma, uncomplicated: Secondary | ICD-10-CM

## 2024-02-14 DIAGNOSIS — F320 Major depressive disorder, single episode, mild: Secondary | ICD-10-CM

## 2024-02-14 MED ORDER — SERTRALINE HCL 50 MG PO TABS: 50.0000 mg | ORAL_TABLET | Freq: Every day | ORAL | 3 refills | Status: AC

## 2024-02-14 MED ORDER — LEVOTHYROXINE SODIUM 112 MCG PO TABS: 112.0000 ug | ORAL_TABLET | Freq: Every day | ORAL | 1 refills | Status: AC

## 2024-02-14 NOTE — Progress Notes (Signed)
 BP 132/88   Pulse (!) 102   Temp 98 F (36.7 C)   Ht 5' 8 (1.727 m)   Wt 177 lb (80.3 kg)   SpO2 98%   BMI 26.91 kg/m    Subjective:    Patient ID: Melissa Harrison, female    DOB: 1980-04-12, 43 y.o.   MRN: [id number]  HPI: Melissa Harrison is a 43 y.o. female  Chief Complaint  Patient presents with   Medication Refill   Discussed the use of AI scribe software for clinical note transcription with the patient, who gave verbal consent to proceed.  History of Present Illness Melissa Harrison is a 43 year old female who presents for a medication refill.  Mood and anxiety symptoms - Uses sertraline  50 mg daily for anxiety and depression - Recent anxiety and depression screening was negative  Ulcerative colitis flare - Experiencing a flare-up of ulcerative colitis - Awaiting further instructions from gastroenterologist - Previously treated with Entyvio , which lost efficacy - Currently receiving a different medication requiring a three-hour infusion - Pulse and blood pressure are slightly elevated, attributed to current flare-up - Recent CBC showed slightly elevated white blood cell count - Recent CMP was normal  Thyroid  function - History of hypothyroidism, denies any changes in skin, weight loss or gain, hair loss or fatigue - Last TSH in August was within normal range -taking levothyroxine  112 mcg daily Lab Results  Component Value Date   TSH 2.17 10/27/2023     Respiratory symptoms - History of asthma - Uses Breo daily         02/14/2024    9:57 AM 07/30/2023   10:04 AM 11/20/2022   12:59 PM  Depression screen PHQ 2/9  Decreased Interest 0 0 0  Down, Depressed, Hopeless 0 0 0  PHQ - 2 Score 0 0 0  Altered sleeping 0 0 0  Tired, decreased energy 0 0 0  Change in appetite 0 0 0  Feeling bad or failure about yourself  0 0 0  Trouble concentrating 0 0 0  Moving slowly or fidgety/restless 0 0 0  Suicidal thoughts 0 0 0  PHQ-9 Score 0 0  0    Difficult doing work/chores Not difficult at all Not difficult at all Not difficult at all     Data saved with a previous flowsheet row definition    Relevant past medical, surgical, family and social history reviewed and updated as indicated. Interim medical history since our last visit reviewed. Allergies and medications reviewed and updated.  Review of Systems  Constitutional: Negative for fever or weight change.  Respiratory: Negative for cough and shortness of breath.   Cardiovascular: Negative for chest pain or palpitations.  Gastrointestinal: Negative for abdominal pain, no bowel changes.  Musculoskeletal: Negative for gait problem or joint swelling.  Skin: Negative for rash.  Neurological: Negative for dizziness or headache.  No other specific complaints in a complete review of systems (except as listed in HPI above).      Objective:      BP 132/88   Pulse (!) 102   Temp 98 F (36.7 C)   Ht 5' 8 (1.727 m)   Wt 177 lb (80.3 kg)   SpO2 98%   BMI 26.91 kg/m    Wt Readings from Last 3 Encounters:  02/14/24 177 lb (80.3 kg)  07/30/23 175 lb 6.4 oz (79.6 kg)  11/20/22 183 lb 3.2 oz (83.1 kg)    Physical Exam GENERAL: Alert,  cooperative, well developed, no acute distress HEENT: Normocephalic, normal oropharynx, moist mucous membranes CHEST: Clear to auscultation bilaterally, No wheezes, rhonchi, or crackles CARDIOVASCULAR: Normal heart rate and rhythm, S1 and S2 normal without murmurs ABDOMEN: Soft, non-tender, non-distended, without organomegaly, Normal bowel sounds EXTREMITIES: No cyanosis or edema NEUROLOGICAL: Cranial nerves grossly intact, Moves all extremities without gross motor or sensory deficit  Results for orders placed or performed in visit on 10/27/23  TSH   Collection Time: 10/27/23  2:55 PM  Result Value Ref Range   TSH 2.17 mIU/L          Assessment & Plan:   Problem List Items Addressed This Visit       Respiratory   Asthma, well  controlled     Endocrine   Hypothyroidism due to Hashimoto's thyroiditis - Primary   Relevant Medications   levothyroxine  (SYNTHROID ) 112 MCG tablet     Other   Generalized anxiety disorder   Relevant Medications   sertraline  (ZOLOFT ) 50 MG tablet   Mild major depression   Relevant Medications   sertraline  (ZOLOFT ) 50 MG tablet     Assessment and Plan Assessment & Plan Depression and generalized anxiety disorder Anxiety and depression screening was negative, indicating stable mental health status. - Refilled sertraline  50 mg daily  Asthma Stable  Hypothyroidism -stable, continue levothyroxine  112 mcg daily      Follow up plan: Return in about 6 months (around 08/14/2024) for follow up.
